# Patient Record
Sex: Female | Born: 1970 | Race: White | Hispanic: No | Marital: Married | Smoking: Former smoker
Health system: Southern US, Community
[De-identification: ages and names within clinical notes are randomized; demographics above are authoritative.]

## PROBLEM LIST (undated history)

## (undated) DIAGNOSIS — F419 Anxiety disorder, unspecified: Secondary | ICD-10-CM

## (undated) DIAGNOSIS — G43829 Menstrual migraine, not intractable, without status migrainosus: Secondary | ICD-10-CM

## (undated) DIAGNOSIS — K219 Gastro-esophageal reflux disease without esophagitis: Secondary | ICD-10-CM

## (undated) DIAGNOSIS — M1990 Unspecified osteoarthritis, unspecified site: Secondary | ICD-10-CM

## (undated) DIAGNOSIS — M329 Systemic lupus erythematosus, unspecified: Secondary | ICD-10-CM

## (undated) HISTORY — DX: Menstrual migraine, not intractable, without status migrainosus: G43.829

## (undated) HISTORY — PX: DIAGNOSTIC LAPAROSCOPY: SUR761

## (undated) HISTORY — PX: ABDOMINAL HYSTERECTOMY: SHX81

## (undated) HISTORY — DX: Unspecified osteoarthritis, unspecified site: M19.90

## (undated) HISTORY — PX: OTHER SURGICAL HISTORY: SHX169

## (undated) HISTORY — PX: DILATION AND CURETTAGE OF UTERUS: SHX78

## (undated) HISTORY — DX: Anxiety disorder, unspecified: F41.9

---

## 2011-06-06 ENCOUNTER — Ambulatory Visit: Payer: Self-pay | Admitting: Internal Medicine

## 2011-08-28 ENCOUNTER — Ambulatory Visit: Payer: Self-pay

## 2012-06-12 ENCOUNTER — Emergency Department: Payer: Self-pay | Admitting: Emergency Medicine

## 2012-06-14 LAB — BETA STREP CULTURE(ARMC)

## 2013-05-13 ENCOUNTER — Ambulatory Visit: Payer: Self-pay | Admitting: Obstetrics and Gynecology

## 2013-05-13 LAB — COMPREHENSIVE METABOLIC PANEL
Albumin: 3.6 g/dL (ref 3.4–5.0)
Alkaline Phosphatase: 57 U/L (ref 50–136)
Anion Gap: 7 (ref 7–16)
Bilirubin,Total: 0.3 mg/dL (ref 0.2–1.0)
Chloride: 105 mmol/L (ref 98–107)
Creatinine: 0.76 mg/dL (ref 0.60–1.30)
EGFR (African American): >60
Glucose: 83 mg/dL (ref 65–99)
SGOT(AST): 20 U/L (ref 15–37)
SGPT (ALT): 16 U/L (ref 12–78)
Total Protein: 7.8 g/dL (ref 6.4–8.2)

## 2013-05-13 LAB — CBC
HCT: 39.1 % (ref 35.0–47.0)
HGB: 13.2 g/dL (ref 12.0–16.0)
MCHC: 33.8 g/dL (ref 32.0–36.0)
MCV: 88 fL (ref 80–100)
Platelet: 304 10*3/uL (ref 150–440)
RDW: 12.8 % (ref 11.5–14.5)
WBC: 5.7 10*3/uL (ref 3.6–11.0)

## 2013-05-13 LAB — PREGNANCY, URINE: Pregnancy Test, Urine: NEGATIVE m[IU]/mL

## 2013-05-22 ENCOUNTER — Ambulatory Visit: Payer: Self-pay | Admitting: Obstetrics and Gynecology

## 2013-05-26 LAB — PATHOLOGY REPORT

## 2013-12-15 ENCOUNTER — Ambulatory Visit: Payer: Self-pay | Admitting: Family Medicine

## 2013-12-21 ENCOUNTER — Emergency Department: Payer: Self-pay | Admitting: Emergency Medicine

## 2013-12-21 LAB — URINALYSIS, COMPLETE
Bilirubin,UR: NEGATIVE
Glucose,UR: NEGATIVE mg/dL (ref 0–75)
KETONE: NEGATIVE
Nitrite: POSITIVE
Ph: 5 (ref 4.5–8.0)
Protein: 100
SPECIFIC GRAVITY: 1.023 (ref 1.003–1.030)

## 2013-12-23 LAB — URINE CULTURE

## 2014-01-06 ENCOUNTER — Emergency Department: Payer: Self-pay | Admitting: Emergency Medicine

## 2014-01-06 LAB — URINALYSIS, COMPLETE
BILIRUBIN, UR: NEGATIVE
Glucose,UR: NEGATIVE mg/dL (ref 0–75)
NITRITE: NEGATIVE
PH: 6 (ref 4.5–8.0)
Specific Gravity: 1.03 (ref 1.003–1.030)

## 2014-01-06 LAB — CBC
HCT: 42.6 % (ref 35.0–47.0)
HGB: 14.2 g/dL (ref 12.0–16.0)
MCH: 29.5 pg (ref 26.0–34.0)
MCHC: 33.3 g/dL (ref 32.0–36.0)
MCV: 89 fL (ref 80–100)
Platelet: 243 10*3/uL (ref 150–440)
RBC: 4.81 10*6/uL (ref 3.80–5.20)
RDW: 13.3 % (ref 11.5–14.5)
WBC: 5.8 10*3/uL (ref 3.6–11.0)

## 2014-01-06 LAB — COMPREHENSIVE METABOLIC PANEL
Albumin: 3.7 g/dL (ref 3.4–5.0)
Alkaline Phosphatase: 51 U/L
Anion Gap: 5 — ABNORMAL LOW (ref 7–16)
BUN: 7 mg/dL (ref 7–18)
Bilirubin,Total: 0.4 mg/dL (ref 0.2–1.0)
CREATININE: 0.76 mg/dL (ref 0.60–1.30)
Calcium, Total: 8.8 mg/dL (ref 8.5–10.1)
Chloride: 100 mmol/L (ref 98–107)
Co2: 28 mmol/L (ref 21–32)
EGFR (Non-African Amer.): >60
GLUCOSE: 97 mg/dL (ref 65–99)
Osmolality: 264 (ref 275–301)
Potassium: 3.4 mmol/L — ABNORMAL LOW (ref 3.5–5.1)
SGOT(AST): 19 U/L (ref 15–37)
SGPT (ALT): 15 U/L (ref 12–78)
Sodium: 133 mmol/L — ABNORMAL LOW (ref 136–145)
Total Protein: 8.6 g/dL — ABNORMAL HIGH (ref 6.4–8.2)

## 2014-07-15 ENCOUNTER — Ambulatory Visit: Payer: Self-pay | Admitting: Gastroenterology

## 2014-11-26 NOTE — Op Note (Signed)
PATIENT NAME:  Vicki Reese, Vicki Reese MR#:  [id number] DATE OF BIRTH:  [birth year]  DATE OF PROCEDURE:  05/22/2013  PREOPERATIVE DIAGNOSES:  1.  Chronic pelvic pain.  2.  Right ovarian cyst, suspicious for endometrioma.   POSTOPERATIVE DIAGNOSES: 1.  Chronic pelvic pain.  2.  Right ovarian cyst, suspicious for endometrioma.   PROCEDURE: 1.  Operative laparoscopy.  2.  Right ovarian cystectomy.  3.  Fulguration of endometriosis implants.   ANESTHESIA: General.   SURGEON: Prentice Docker, M.D.   ASSISTANT SURGEON: Malachy Mood, MD   ESTIMATED BLOOD LOSS: 50 mL.  OPERATIVE FLUIDS: 1000 mL crystalloid.   COMPLICATIONS: None.   FINDINGS:  1.  Cyst on right ovary, appears consistent with endometrioma.  2.  Multiple implants throughout pelvis consistent with endometriosis implants.  3.  Uterus with irregular contour, consistent with likely fibroids. 4.  Otherwise normal appearing pelvic anatomy.   SPECIMEN: Right ovarian cyst wall.   CONDITION AT THE END OF PROCEDURE: Stable.   PROCEDURE IN DETAIL: The patient was taken to the operating room where general anesthesia was administered and found to be adequate. She was placed in the dorsal supine lithotomy position and prepped and draped in the usual sterile fashion. After a timeout was called, a red rubber catheter was used for straight catheterization with return of no urine. A sterile sponge on a stick was placed in the vagina for uterine manipulation.   Attention was turned to the abdomen where, after injection of local anesthetic, a 5 mm infraumbilical incision was made with a scalpel. The abdomen and was entered using direct visualization with the Optiview trocar method. Verification of abdominal entry was obtained by opening pressure. The abdomen was then insufflated with CO2. The scope was then reintroduced through the 5 mm port and atraumatic entry was verified. The abdomen was surveyed with the above-noted findings. The right  ovarian cyst was unroofed and then attempt was made to remove the cyst intact. However, the cyst did  rupture. After removal of the cyst wall and fulguration of the cyst wall where the cyst wall was attached to the ovary to obtain hemostasis and to reduce any remaining endometriosis disease, attention was then turned to the rest of the pelvis. There were endometriosis implants located throughout the cul-de-sac, especially along the right uterosacral ligament, which was then fulgurated using monopolar electrocautery. Anterior to the uterus near the round ligament, there were noted to be multiple endometriosis implants, which were also similarly fulgurated. After no obvious lesions remained that could be destroyed, the procedure was terminated. Copious lavage of the pelvis was undertaken and suctioned and hemostasis was verified.   All instrumentation was removed after the abdomen was desufflated of CO2. Each incision was closed with Dermabond and additional lidocaine was injected for pain control.   The patient tolerated the procedure well. Sponge, lap, and needle counts were correct x 2. The patient was wearing pneumatic compression stockings for VTE prophylaxis throughout the entire procedure. At the end of the procedure, the sponge stick was removed from the vagina and the vagina was inspected to ensure that no instrumentation remained. She was awakened in the operating room and taken to the recovery room in stable condition.    ____________________________ Will Bonnet, MD sdj:aw D: 05/22/2013 10:41:27 ET T: 05/22/2013 11:51:13 ET JOB#: [id number]  cc: Will Bonnet, MD, <Dictator> Will Bonnet MD ELECTRONICALLY SIGNED 06/04/2013 14:49

## 2014-12-06 LAB — HM PAP SMEAR

## 2015-01-17 ENCOUNTER — Telehealth: Payer: Self-pay | Admitting: Physician Assistant

## 2015-01-17 DIAGNOSIS — F329 Major depressive disorder, single episode, unspecified: Secondary | ICD-10-CM

## 2015-01-17 DIAGNOSIS — F419 Anxiety disorder, unspecified: Secondary | ICD-10-CM | POA: Insufficient documentation

## 2015-01-17 DIAGNOSIS — M222X9 Patellofemoral disorders, unspecified knee: Secondary | ICD-10-CM | POA: Insufficient documentation

## 2015-01-17 DIAGNOSIS — F32A Depression, unspecified: Secondary | ICD-10-CM | POA: Insufficient documentation

## 2015-01-17 MED ORDER — BUPROPION HCL ER (SR) 150 MG PO TB12: 150.0000 mg | ORAL_TABLET | Freq: Every day | ORAL | Status: DC

## 2015-01-17 NOTE — Telephone Encounter (Signed)
Wellbutrin 153m Rx sent to [facility].  If not enough control with 1544mmay take 1 tablet q 12 hrs instead of one daily.  Needs f/u in one month to evaluate effects with wellbutrin if she does not already have an appt.  Thanks! -JB

## 2015-01-17 NOTE — Telephone Encounter (Signed)
LMTCB

## 2015-01-17 NOTE — Telephone Encounter (Signed)
Pt request Jenni's nurse call her back because she would like to know what her last A1C was and she wanted to go over her medications with a nurse. Thanks TNP

## 2015-01-17 NOTE — Telephone Encounter (Signed)
Patient advised as direct below. Patient verbalized understanding. Patient will call back to schedule 1 month follow up appointment.

## 2015-01-17 NOTE — Telephone Encounter (Signed)
Returned patient's call. Patient wanted to know her last A1C.  Patient also states she has been trying to wean off Paxil and she is doing okay ,but still a little moody. Patient states she is taking Paxil every other day and she feels "edgy" . Patient states she does not believe she will be able to not take the medication at all.   Patient is requesting to try Wellbutrin as discussed at last OV. Please advise.   [facility] [address]

## 2015-01-19 DIAGNOSIS — M719 Bursopathy, unspecified: Secondary | ICD-10-CM | POA: Insufficient documentation

## 2015-01-19 DIAGNOSIS — R635 Abnormal weight gain: Secondary | ICD-10-CM | POA: Insufficient documentation

## 2015-01-19 DIAGNOSIS — N809 Endometriosis, unspecified: Secondary | ICD-10-CM | POA: Insufficient documentation

## 2015-02-11 ENCOUNTER — Other Ambulatory Visit: Payer: Self-pay | Admitting: Physician Assistant

## 2015-02-14 ENCOUNTER — Ambulatory Visit (INDEPENDENT_AMBULATORY_CARE_PROVIDER_SITE_OTHER): Payer: PRIVATE HEALTH INSURANCE | Admitting: Physician Assistant

## 2015-02-14 ENCOUNTER — Encounter: Payer: Self-pay | Admitting: Physician Assistant

## 2015-02-14 VITALS — BP 106/64 | HR 68 | Temp 98.1°F | Resp 16 | Wt 171.4 lb

## 2015-02-14 DIAGNOSIS — F329 Major depressive disorder, single episode, unspecified: Secondary | ICD-10-CM | POA: Diagnosis not present

## 2015-02-14 DIAGNOSIS — F32A Depression, unspecified: Secondary | ICD-10-CM

## 2015-02-14 DIAGNOSIS — G479 Sleep disorder, unspecified: Secondary | ICD-10-CM | POA: Diagnosis not present

## 2015-02-14 MED ORDER — BUPROPION HCL ER (XL) 300 MG PO TB24: 300.0000 mg | ORAL_TABLET | Freq: Every day | ORAL | Status: DC

## 2015-02-14 NOTE — Patient Instructions (Signed)
Bupropion extended-release tablets (Depression/Mood Disorders) What is this medicine? BUPROPION (byoo PROE pee on) is used to treat depression. This medicine may be used for other purposes; ask your health care provider or pharmacist if you have questions. COMMON BRAND NAME(S): Aplenzin, Budeprion XL, Forfivo XL, Wellbutrin XL What should I tell my health care provider before I take this medicine? They need to know if you have any of these conditions: -an eating disorder, such as anorexia or bulimia -bipolar disorder or psychosis -diabetes or high blood sugar, treated with medication -glaucoma -head injury or brain tumor -heart disease, previous heart attack, or irregular heart beat -high blood pressure -kidney or liver disease -seizures (convulsions) -suicidal thoughts or a previous suicide attempt -Tourette's syndrome -weight loss -an unusual or allergic reaction to bupropion, other medicines, foods, dyes, or preservatives -breast-feeding -pregnant or trying to become pregnant How should I use this medicine? Take this medicine by mouth with a glass of water. Follow the directions on the prescription label. You can take it with or without food. If it upsets your stomach, take it with food. Do not crush, chew, or cut these tablets. This medicine is taken once daily at the same time each day. Do not take your medicine more often than directed. Do not stop taking this medicine suddenly except upon the advice of your doctor. Stopping this medicine too quickly may cause serious side effects or your condition may worsen. A special MedGuide will be given to you by the pharmacist with each prescription and refill. Be sure to read this information carefully each time. Talk to your pediatrician regarding the use of this medicine in children. Special care may be needed. Overdosage: If you think you have taken too much of this medicine contact a poison control center or emergency room at once. NOTE:  This medicine is only for you. Do not share this medicine with others. What if I miss a dose? If you miss a dose, skip the missed dose and take your next tablet at the regular time. Do not take double or extra doses. What may interact with this medicine? Do not take this medicine with any of the following medications: -linezolid -MAOIs like Azilect, Carbex, Eldepryl, Marplan, Nardil, and Parnate -methylene blue (injected into a vein) -other medicines that contain bupropion like Zyban This medicine may also interact with the following medications: -alcohol -certain medicines for anxiety or sleep -certain medicines for blood pressure like metoprolol, propranolol -certain medicines for depression or psychotic disturbances -certain medicines for HIV or AIDS like efavirenz, lopinavir, nelfinavir, ritonavir -certain medicines for irregular heart beat like propafenone, flecainide -certain medicines for Parkinson's disease like amantadine, levodopa -certain medicines for seizures like carbamazepine, phenytoin, phenobarbital -cimetidine -clopidogrel -cyclophosphamide -furazolidone -isoniazid -nicotine -orphenadrine -procarbazine -steroid medicines like prednisone or cortisone -stimulant medicines for attention disorders, weight loss, or to stay awake -tamoxifen -theophylline -thiotepa -ticlopidine -tramadol -warfarin This list may not describe all possible interactions. Give your health care provider a list of all the medicines, herbs, non-prescription drugs, or dietary supplements you use. Also tell them if you smoke, drink alcohol, or use illegal drugs. Some items may interact with your medicine. What should I watch for while using this medicine? Tell your doctor if your symptoms do not get better or if they get worse. Visit your doctor or health care professional for regular checks on your progress. Because it may take several weeks to see the full effects of this medicine, it is  important to continue your treatment as prescribed  by your doctor. Patients and their families should watch out for new or worsening thoughts of suicide or depression. Also watch out for sudden changes in feelings such as feeling anxious, agitated, panicky, irritable, hostile, aggressive, impulsive, severely restless, overly excited and hyperactive, or not being able to sleep. If this happens, especially at the beginning of treatment or after a change in dose, call your health care professional. Avoid alcoholic drinks while taking this medicine. Drinking large amounts of alcoholic beverages, using sleeping or anxiety medicines, or quickly stopping the use of these agents while taking this medicine may increase your risk for a seizure. Do not drive or use heavy machinery until you know how this medicine affects you. This medicine can impair your ability to perform these tasks. Do not take this medicine close to bedtime. It may prevent you from sleeping. Your mouth may get dry. Chewing sugarless gum or sucking hard candy, and drinking plenty of water may help. Contact your doctor if the problem does not go away or is severe. The tablet shell for some brands of this medicine does not dissolve. This is normal. The tablet shell may appear whole in the stool. This is not a cause for concern. What side effects may I notice from receiving this medicine? Side effects that you should report to your doctor or health care professional as soon as possible: -allergic reactions like skin rash, itching or hives, swelling of the face, lips, or tongue -breathing problems -changes in vision -confusion -fast or irregular heartbeat -hallucinations -increased blood pressure -redness, blistering, peeling or loosening of the skin, including inside the mouth -seizures -suicidal thoughts or other mood changes -unusually weak or tired -vomiting Side effects that usually do not require medical attention (report to your  doctor or health care professional if they continue or are bothersome): -change in sex drive or performance -constipation -headache -loss of appetite -nausea -tremors -weight loss This list may not describe all possible side effects. Call your doctor for medical advice about side effects. You may report side effects to FDA at 1-800-FDA-1088. Where should I keep my medicine? Keep out of the reach of children. Store at room temperature between 15 and 30 degrees C (59 and 86 degrees F). Throw away any unused medicine after the expiration date. NOTE: This sheet is a summary. It may not cover all possible information. If you have questions about this medicine, talk to your doctor, pharmacist, or health care provider.  2015, [employer]vier/Gold Standard. (2013-02-13 12:39:42)

## 2015-02-14 NOTE — Progress Notes (Signed)
Subjective:     Patient ID: Vicki Reese, female   DOB: 1970-08-11, 44 y.o.   MRN: [id number]  HPI Vicki Reese is a 44 year old female that returns to the office today for follow-up of change in medication. She recently switched from Paxil to Wellbutrin. She states that she has been tolerating the Wellbutrin well and has had no major side effects. She is compliant with medication.  She does mention however that she feels she is more emotional and cries more easily than she had previously. She also has been working on weight loss and has lost 10 pounds since she was last seen in the office.  She also mentions that she has been having difficulties going to sleep more so than usual, is finding herself to worry and make lists, and even sleep with a notepad beside her bed in case she thinks of something while she is lying still.  Review of Systems  Constitutional: Negative for fever, chills, activity change, appetite change, fatigue and unexpected weight change.  Respiratory: Negative for choking, chest tightness and shortness of breath.   Cardiovascular: Negative for chest pain and palpitations.  Neurological: Negative for dizziness, light-headedness and headaches.  Psychiatric/Behavioral: Negative for suicidal ideas, hallucinations, behavioral problems, confusion, sleep disturbance, self-injury, dysphoric mood, decreased concentration and agitation. The patient is nervous/anxious (at night). The patient is not hyperactive.        Objective:   Physical Exam  Constitutional: She is oriented to person, place, and time. She appears well-developed and well-nourished. No distress.  Cardiovascular: Normal rate, regular rhythm and normal heart sounds.  Exam reveals no gallop and no friction rub.   No murmur heard. Pulmonary/Chest: Effort normal and breath sounds normal. No respiratory distress. She has no wheezes. She has no rales.  Neurological: She is alert and oriented to person, place, and time.   Skin: She is not diaphoretic.  Psychiatric: She has a normal mood and affect. Her behavior is normal. Judgment and thought content normal.  Vitals reviewed.      Assessment:     1. Depression   2. Sleep disorder        Plan:     1. Depression We'll increase Wellbutrin to 300 mg tablet once daily to see if this gives better control of her crying spells.  Recheck in 1-2 months. - buPROPion (WELLBUTRIN XL) 300 MG 24 hr tablet; Take 1 tablet (300 mg total) by mouth daily.  Dispense: 30 tablet; Refill: 3  2. Sleep disorder Discussed sleep hygiene and sleep meditation exercises. She is willing to give this a try and we will reevaluate in 1-2 months.

## 2015-02-21 ENCOUNTER — Telehealth: Payer: Self-pay | Admitting: Physician Assistant

## 2015-02-21 NOTE — Telephone Encounter (Signed)
Pt is returning call to [place].  Please call after 4pm.  CB#[phone]/MJ

## 2015-02-24 MED ORDER — BUPROPION HCL ER (XL) 150 MG PO TB24: 300.0000 mg | ORAL_TABLET | Freq: Every day | ORAL | Status: DC

## 2015-02-24 NOTE — Telephone Encounter (Signed)
Pt called again 02/24/15 9:15,  Please return call.  [phone]

## 2015-02-24 NOTE — Telephone Encounter (Signed)
Spoke with patient and will do bupropion XR 150m tab take 2 tabs PO daily.  New Rx sent in.

## 2015-03-21 ENCOUNTER — Encounter: Payer: Self-pay | Admitting: Physician Assistant

## 2015-03-21 ENCOUNTER — Ambulatory Visit (INDEPENDENT_AMBULATORY_CARE_PROVIDER_SITE_OTHER): Payer: PRIVATE HEALTH INSURANCE | Admitting: Physician Assistant

## 2015-03-21 VITALS — BP 110/64 | HR 88 | Temp 98.5°F | Resp 20 | Ht 63.0 in | Wt 168.0 lb

## 2015-03-21 DIAGNOSIS — R7303 Prediabetes: Secondary | ICD-10-CM

## 2015-03-21 DIAGNOSIS — F329 Major depressive disorder, single episode, unspecified: Secondary | ICD-10-CM | POA: Diagnosis not present

## 2015-03-21 DIAGNOSIS — R5383 Other fatigue: Secondary | ICD-10-CM | POA: Insufficient documentation

## 2015-03-21 DIAGNOSIS — R7309 Other abnormal glucose: Secondary | ICD-10-CM

## 2015-03-21 DIAGNOSIS — R635 Abnormal weight gain: Secondary | ICD-10-CM | POA: Diagnosis not present

## 2015-03-21 DIAGNOSIS — R61 Generalized hyperhidrosis: Secondary | ICD-10-CM

## 2015-03-21 DIAGNOSIS — F32A Depression, unspecified: Secondary | ICD-10-CM

## 2015-03-21 DIAGNOSIS — N809 Endometriosis, unspecified: Secondary | ICD-10-CM

## 2015-03-21 LAB — POCT GLYCOSYLATED HEMOGLOBIN (HGB A1C): Hemoglobin A1C: 5.5

## 2015-03-21 MED ORDER — NORETHIN ACE-ETH ESTRAD-FE 1-20 MG-MCG(24) PO TABS: 1.0000 | ORAL_TABLET | Freq: Every day | ORAL | Status: DC

## 2015-03-21 NOTE — Progress Notes (Signed)
Patient ID: Vicki Reese, female   DOB: [birth year], 44 y.o.   MRN: [id number]       Patient: Vicki Reese Post Female    DOB: [birth year]   44 y.o.   MRN: [id number] Visit Date: 03/21/2015  Today's Provider: Mar Daring, PA-C   Chief Complaint  Patient presents with  . Diabetes  . Night Sweats  . Depression   Subjective:    HPI  Prediabetes, Follow-up:   Lab Results  Component Value Date   GLUCOSE 97 01/06/2014   GLUCOSE 83 05/13/2013   Results for orders placed or performed in visit on 03/21/15  POCT glycosylated hemoglobin (Hb A1C)  Result Value Ref Range   Hemoglobin A1C 5.5      Last seen for for this3 months ago.  Management changes included healthy lifestyle. Current symptoms include none.  Weight trend: decreasing steadily Prior visit with dietician: no Current diet: in general, a "healthy" diet  . Patient reports that she cut out sweets and low carbs. Current exercise: exercise videos, 3 times a week.   Patient reports that she is still having sweats at night. Patient reports symptom unchanged.  Pertinent Labs:    Component Value Date/Time   CREATININE 0.76 01/06/2014 1740    Wt Readings from Last 3 Encounters:  03/21/15 168 lb (76.204 kg)  02/14/15 171 lb 6.4 oz (77.747 kg)  12/23/14 179 lb (81.194 kg)   Depression Follow up: Patient complains of depression. She complains of depressed mood. Onset was approximately several years ago, gradually improving since that time.  She denies current suicidal and homicidal plan or intent.   Family history significant for no psychiatric illness.Possible organic causes contributing are: none.  Risk factors: none Previous treatment includes Paxil and medication. She complains of the following side effects from the treatment: none. Patient reports good tolerance and symptom control on Wellbutrin.   ------------------------------------------------------------------------       No Known Allergies Previous  Medications   BUPROPION (WELLBUTRIN XL) 150 MG 24 HR TABLET    Take 2 tablets (300 mg total) by mouth daily.   LO LOESTRIN FE 1 MG-10 MCG / 10 MCG TABLET    TAKE 1 TABLET BY ORAL ROUTE ONCE DAILY FOR 28 DAYS   MELOXICAM (MOBIC) 15 MG TABLET    Take 15 mg by mouth daily.    Review of Systems  Constitutional: Positive for diaphoresis and fatigue (much improved from previous however).  Respiratory: Negative.   Cardiovascular: Negative.   Endocrine: Negative.   Genitourinary: Negative.   Psychiatric/Behavioral: Negative.     Social History  Substance Use Topics  . Smoking status: Former Research scientist (life sciences)  . Smokeless tobacco: Never Used     Comment: QUIT IN 2008  . Alcohol Use: 0.0 oz/week    0 Standard drinks or equivalent per week     Comment: OCCASIONALLY   Objective:   BP 110/64 mmHg  Pulse 88  Temp(Src) 98.5 F (36.9 C) (Oral)  Resp 20  Ht 5' 3"  (1.6 m)  Wt 168 lb (76.204 kg)  BMI 29.77 kg/m2  LMP 03/06/2015 (Exact Date)  Physical Exam  Constitutional: She appears well-developed and well-nourished. No distress.  Cardiovascular: Normal rate, regular rhythm and normal heart sounds.   No murmur heard. Pulmonary/Chest: Effort normal and breath sounds normal. No respiratory distress. She has no wheezes. She has no rales.  Skin: Skin is warm and dry. She is not diaphoretic.  Psychiatric: She has a normal mood and affect. Her behavior is  normal. Judgment and thought content normal.  Vitals reviewed.       Assessment & Plan:     1. Pre-diabetes Hemoglobin A1c improved to 5.5 today. Advised her To continue diet and exercise and healthy lifestyle changes. Will recheck in one year. - POCT glycosylated hemoglobin (Hb A1C)  2. Abnormal weight gain She is doing well with healthy lifestyle changes, diet and exercise. She has lost a total of 14 pounds in 2 months.  3. Other fatigue States fatigue is much improved with Wellbutrin 300 mg daily. She is to continue the Wellbutrin as  prescribed as well as her healthy lifestyle changes and daily exercise.  4. Night sweats States that the night sweats have gradually been worsening over time. She has not had any improvement with the Wellbutrin. I will increase her oral contraceptive as below. She is to call the office if the hot flashes do not improve with the increased estrogen. - Norethindrone Acetate-Ethinyl Estrad-FE (LOESTRIN 24 FE) 1-20 MG-MCG(24) tablet; Take 1 tablet by mouth daily.  Dispense: 1 Package; Refill: 11  5. Endometriosis She has a history of this. She is followed by Dr. Glennon Mac. She was previously put on low Loestrin for her endometriosis and fibroids. I will increase the dosage to Loestrin 1 mg-20 mg as stated below for night sweats. She is to call the office if she has any worsening symptoms with the change in medication. - Norethindrone Acetate-Ethinyl Estrad-FE (LOESTRIN 24 FE) 1-20 MG-MCG(24) tablet; Take 1 tablet by mouth daily.  Dispense: 1 Package; Refill: 11  6. Depression Much improved with the Wellbutrin 300 mg daily. Continue current medical treatment plan. She is to call the office if she has any changes or worsening symptoms. Will recheck in 6 months.       Mar Daring, PA-C  [facility]

## 2015-03-21 NOTE — Patient Instructions (Signed)
Exercise to Lose Weight Exercise and a healthy diet may help you lose weight. Your doctor may suggest specific exercises. EXERCISE IDEAS AND TIPS  Choose low-cost things you enjoy doing, such as walking, bicycling, or exercising to workout videos.  Take stairs instead of the elevator.  Walk during your lunch break.  Park your car further away from work or school.  Go to a gym or an exercise class.  Start with 5 to 10 minutes of exercise each day. Build up to 30 minutes of exercise 4 to 6 days a week.  Wear shoes with good support and comfortable clothes.  Stretch before and after working out.  Work out until you breathe harder and your heart beats faster.  Drink extra water when you exercise.  Do not do so much that you hurt yourself, feel dizzy, or get very short of breath. Exercises that burn about 150 calories:  Running 1  miles in 15 minutes.  Playing volleyball for 45 to 60 minutes.  Washing and waxing a car for 45 to 60 minutes.  Playing touch football for 45 minutes.  Walking 1  miles in 35 minutes.  Pushing a stroller 1  miles in 30 minutes.  Playing basketball for 30 minutes.  Raking leaves for 30 minutes.  Bicycling 5 miles in 30 minutes.  Walking 2 miles in 30 minutes.  Dancing for 30 minutes.  Shoveling snow for 15 minutes.  Swimming laps for 20 minutes.  Walking up stairs for 15 minutes.  Bicycling 4 miles in 15 minutes.  Gardening for 30 to 45 minutes.  Jumping rope for 15 minutes.  Washing windows or floors for 45 to 60 minutes. Document Released: 08/25/2010 Document Revised: 10/15/2011 Document Reviewed: 08/25/2010 [facility] Patient Information 2015 [place], [place]. This information is not intended to replace advice given to you by your health care provider. Make sure you discuss any questions you have with your health care provider. Calorie Counting for Weight Loss Calories are energy you get from the things you eat and drink. Your  body uses this energy to keep you going throughout the day. The number of calories you eat affects your weight. When you eat more calories than your body needs, your body stores the extra calories as fat. When you eat fewer calories than your body needs, your body burns fat to get the energy it needs. Calorie counting means keeping track of how many calories you eat and drink each day. If you make sure to eat fewer calories than your body needs, you should lose weight. In order for calorie counting to work, you will need to eat the number of calories that are right for you in a day to lose a healthy amount of weight per week. A healthy amount of weight to lose per week is usually 1-2 lb (0.5-0.9 kg). A dietitian can determine how many calories you need in a day and give you suggestions on how to reach your calorie goal.  WHAT IS MY MY PLAN? My goal is to have __________ calories per day.  If I have this many calories per day, I should lose around __________ pounds per week. WHAT DO I NEED TO KNOW ABOUT CALORIE COUNTING? In order to meet your daily calorie goal, you will need to:  Find out how many calories are in each food you would like to eat. Try to do this before you eat.  Decide how much of the food you can eat.  Write down what you ate and  how many calories it had. Doing this is called keeping a food log. WHERE DO I FIND CALORIE INFORMATION? The number of calories in a food can be found on a Nutrition Facts label. Note that all the information on a label is based on a specific serving of the food. If a food does not have a Nutrition Facts label, try to look up the calories online or ask your dietitian for help. HOW DO I DECIDE HOW MUCH TO EAT? To decide how much of the food you can eat, you will need to consider both the number of calories in one serving and the size of one serving. This information can be found on the Nutrition Facts label. If a food does not have a Nutrition Facts label, look  up the information online or ask your dietitian for help. Remember that calories are listed per serving. If you choose to have more than one serving of a food, you will have to multiply the calories per serving by the amount of servings you plan to eat. For example, the label on a package of bread might say that a serving size is 1 slice and that there are 90 calories in a serving. If you eat 1 slice, you will have eaten 90 calories. If you eat 2 slices, you will have eaten 180 calories. HOW DO I KEEP A FOOD LOG? After each meal, record the following information in your food log:  What you ate.  How much of it you ate.  How many calories it had.  Then, add up your calories. Keep your food log near you, such as in a small notebook in your pocket. Another option is to use a mobile app or website. Some programs will calculate calories for you and show you how many calories you have left each time you add an item to the log. WHAT ARE SOME CALORIE COUNTING TIPS?  Use your calories on foods and drinks that will fill you up and not leave you hungry. Some examples of this include foods like nuts and nut butters, vegetables, lean proteins, and high-fiber foods (more than 5 g fiber per serving).  Eat nutritious foods and avoid empty calories. Empty calories are calories you get from foods or beverages that do not have many nutrients, such as candy and soda. It is better to have a nutritious high-calorie food (such as an avocado) than a food with few nutrients (such as a bag of chips).  Know how many calories are in the foods you eat most often. This way, you do not have to look up how many calories they have each time you eat them.  Look out for foods that may seem like low-calorie foods but are really high-calorie foods, such as baked goods, soda, and fat-free candy.  Pay attention to calories in drinks. Drinks such as sodas, specialty coffee drinks, alcohol, and juices have a lot of calories yet do  not fill you up. Choose low-calorie drinks like water and diet drinks.  Focus your calorie counting efforts on higher calorie items. Logging the calories in a garden salad that contains only vegetables is less important than calculating the calories in a milk shake.  Find a way of tracking calories that works for you. Get creative. Most people who are successful find ways to keep track of how much they eat in a day, even if they do not count every calorie. WHAT ARE SOME PORTION CONTROL TIPS?  Know how many calories are in a  serving. This will help you know how many servings of a certain food you can have.  Use a measuring cup to measure serving sizes. This is helpful when you start out. With time, you will be able to estimate serving sizes for some foods.  Take some time to put servings of different foods on your favorite plates, bowls, and cups so you know what a serving looks like.  Try not to eat straight from a bag or box. Doing this can lead to overeating. Put the amount you would like to eat in a cup or on a plate to make sure you are eating the right portion.  Use smaller plates, glasses, and bowls to prevent overeating. This is a quick and easy way to practice portion control. If your plate is smaller, less food can fit on it.  Try not to multitask while eating, such as watching TV or using your computer. If it is time to eat, sit down at a table and enjoy your food. Doing this will help you to start recognizing when you are full. It will also make you more aware of what and how much you are eating. HOW CAN I CALORIE COUNT WHEN EATING OUT?  Ask for smaller portion sizes or child-sized portions.  Consider sharing an entree and sides instead of getting your own entree.  If you get your own entree, eat only half. Ask for a box at the beginning of your meal and put the rest of your entree in it so you are not tempted to eat it.  Look for the calories on the menu. If calories are listed,  choose the lower calorie options.  Choose dishes that include vegetables, fruits, whole grains, low-fat dairy products, and lean protein. Focusing on smart food choices from each of the 5 food groups can help you stay on track at restaurants.  Choose items that are boiled, broiled, grilled, or steamed.  Choose water, milk, unsweetened iced tea, or other drinks without added sugars. If you want an alcoholic beverage, choose a lower calorie option. For example, a regular margarita can have up to 700 calories and a glass of wine has around 150.  Stay away from items that are buttered, battered, fried, or served with cream sauce. Items labeled "crispy" are usually fried, unless stated otherwise.  Ask for dressings, sauces, and syrups on the side. These are usually very high in calories, so do not eat much of them.  Watch out for salads. Many people think salads are a healthy option, but this is often not the case. Many salads come with bacon, fried chicken, lots of cheese, fried chips, and dressing. All of these items have a lot of calories. If you want a salad, choose a garden salad and ask for grilled meats or steak. Ask for the dressing on the side, or ask for olive oil and vinegar or lemon to use as dressing.  Estimate how many servings of a food you are given. For example, a serving of cooked rice is  cup or about the size of half a tennis ball or one cupcake wrapper. Knowing serving sizes will help you be aware of how much food you are eating at restaurants. The list below tells you how big or small some common portion sizes are based on everyday objects.  1 oz--4 stacked dice.  3 oz--1 deck of cards.  1 tsp--1 dice.  1 Tbsp-- a Ping-Pong ball.  2 Tbsp--1 Ping-Pong ball.   cup--1 tennis ball  or 1 cupcake wrapper.  1 cup--1 baseball. Document Released: 07/23/2005 Document Revised: 12/07/2013 Document Reviewed: 05/28/2013 [facility] Patient Information 2015 [place], [place]. This  information is not intended to replace advice given to you by your health care provider. Make sure you discuss any questions you have with your health care provider.

## 2015-05-05 ENCOUNTER — Ambulatory Visit (INDEPENDENT_AMBULATORY_CARE_PROVIDER_SITE_OTHER): Payer: PRIVATE HEALTH INSURANCE | Admitting: Physician Assistant

## 2015-05-05 ENCOUNTER — Encounter: Payer: Self-pay | Admitting: Physician Assistant

## 2015-05-05 VITALS — BP 100/78 | HR 72 | Temp 98.2°F | Resp 18 | Wt 166.0 lb

## 2015-05-05 DIAGNOSIS — R110 Nausea: Secondary | ICD-10-CM

## 2015-05-05 DIAGNOSIS — H8110 Benign paroxysmal vertigo, unspecified ear: Secondary | ICD-10-CM

## 2015-05-05 MED ORDER — PROMETHAZINE HCL 25 MG PO TABS: 25.0000 mg | ORAL_TABLET | Freq: Three times a day (TID) | ORAL | Status: DC | PRN

## 2015-05-05 MED ORDER — MECLIZINE HCL 32 MG PO TABS: 32.0000 mg | ORAL_TABLET | Freq: Three times a day (TID) | ORAL | Status: DC | PRN

## 2015-05-05 MED ORDER — PROMETHAZINE HCL 25 MG/ML IJ SOLN
25.0000 mg | Freq: Once | INTRAMUSCULAR | Status: AC
  Administered 2015-05-05: 25 mg via INTRAMUSCULAR

## 2015-05-05 NOTE — Progress Notes (Signed)
Patient: Vicki Reese Post Female    DOB: [birth year]   44 y.o.   MRN: [id number] Visit Date: 05/05/2015  Today's Vicki Reese: Mar Daring, PA-C   Chief Complaint  Patient presents with  . Dizziness   Subjective:    Dizziness This is a new problem. The current episode started in the past 7 days. The problem occurs intermittently (only when stands up). The problem has been unchanged. Associated symptoms include chills, fatigue, myalgias, nausea, vertigo and weakness. Pertinent negatives include no abdominal pain, chest pain, congestion, fever or visual change. The symptoms are aggravated by standing and walking. She has tried drinking (anti nausea medicine OTC) for the symptoms. The treatment provided no relief.  She has no appetite secondary to nausea.  This all started on Sunday evening.  She did not take any medication until Tuesday evening.  She took dramamine prior to bed on Tuesday and it helped her sleep, but when she awoke the nausea and dizziness had returned.  She has not taken it regularly.  She denies headaches, double vision, or vomiting.  The dizziness occurs only with movements, mostly going from lying/sitting to standing.     No Known Allergies Previous Medications   BUPROPION (WELLBUTRIN XL) 150 MG 24 HR TABLET    Take 2 tablets (300 mg total) by mouth daily.   MELOXICAM (MOBIC) 15 MG TABLET    Take 15 mg by mouth daily.   NORETHINDRONE ACETATE-ETHINYL ESTRAD-FE (LOESTRIN 24 FE) 1-20 MG-MCG(24) TABLET    Take 1 tablet by mouth daily.    Review of Systems  Constitutional: Positive for chills and fatigue. Negative for fever.  HENT: Negative for congestion and sinus pressure.   Eyes: Negative.   Respiratory: Positive for chest tightness (a little) and shortness of breath (a little).   Cardiovascular: Negative.  Negative for chest pain and palpitations.  Gastrointestinal: Positive for nausea. Negative for abdominal pain.  Endocrine: Negative.   Genitourinary:  Negative.   Musculoskeletal: Positive for myalgias.  Skin: Negative.   Allergic/Immunologic: Negative.   Neurological: Positive for dizziness, vertigo, tremors (when patient sits up feels body shaking), weakness and light-headedness.  Hematological: Negative.   Psychiatric/Behavioral: The patient is nervous/anxious.     Social History  Substance Use Topics  . Smoking status: Former Research scientist (life sciences)  . Smokeless tobacco: Never Used     Comment: QUIT IN 2008  . Alcohol Use: 0.0 oz/week    0 Standard drinks or equivalent per week     Comment: OCCASIONALLY   Objective:   BP 100/78 mmHg  Pulse 72  Temp(Src) 98.2 F (36.8 C) (Oral)  Resp 18  Wt 166 lb (75.297 kg)  LMP 04/28/2015  Physical Exam  Constitutional: She is oriented to person, place, and time. She appears well-developed and well-nourished. No distress.  HENT:  Head: Normocephalic and atraumatic.  Right Ear: Hearing, tympanic membrane, external ear and ear canal normal.  Left Ear: Hearing, tympanic membrane, external ear and ear canal normal.  Nose: Nose normal.  Mouth/Throat: Uvula is midline, oropharynx is clear and moist and mucous membranes are normal. No oropharyngeal exudate.  Eyes: Conjunctivae are normal. Pupils are equal, round, and reactive to light. Right eye exhibits no nystagmus. Left eye exhibits nystagmus.  Neck: Normal range of motion. Neck supple. No tracheal deviation present. No thyromegaly present.  Cardiovascular: Normal rate, regular rhythm and normal heart sounds.  Exam reveals no gallop and no friction rub.   No murmur heard. Pulmonary/Chest: Effort normal  and breath sounds normal. No respiratory distress. She has no wheezes. She has no rales.  Lymphadenopathy:    She has no cervical adenopathy.  Neurological: She is alert and oriented to person, place, and time. She has normal strength and normal reflexes. No cranial nerve deficit or sensory deficit. She exhibits normal muscle tone. She displays a negative  Romberg sign. Coordination normal.  Skin: She is not diaphoretic.  Vitals reviewed.       Assessment & Plan:     1. BPPV (benign paroxysmal positional vertigo), unspecified laterality I feel her symptoms are most likely BPPV.  I explained and performed the brandt-daroff exercises for her to do at home.  I also gave meclizine as below for symptomatic treatment.  I also encouraged her to increase fluids as she may have some dehydration causing some of the symptoms as well since her BP is down from her normal readings.  She is to call the office if symptoms fail to improve or worsen. - meclizine (ANTIVERT) 25 MG tablet; Take 1 tablet (32 mg total) by mouth 3 (three) times daily as needed.  Dispense: 30 tablet; Refill: 0  2. Nausea Due to her severe nausea I gave an IM injection pf promethazine as well as gave her a Rx for promethazine as below for nausea.  She is to call the office if symptoms fail to improve or worsen. - promethazine (PHENERGAN) 25 MG tablet; Take 1 tablet (25 mg total) by mouth every 8 (eight) hours as needed for nausea or vomiting.  Dispense: 20 tablet; Refill: 0 - promethazine (PHENERGAN) injection 25 mg; Inject 1 mL (25 mg total) into the muscle once.       Mar Daring, PA-C  [facility]

## 2015-05-05 NOTE — Patient Instructions (Signed)
Benign Positional Vertigo Vertigo means you feel like you or your surroundings are moving when they are not. Benign positional vertigo is the most common form of vertigo. Benign means that the cause of your condition is not serious. Benign positional vertigo is more common in older adults. CAUSES  Benign positional vertigo is the result of an upset in the labyrinth system. This is an area in the middle ear that helps control your balance. This may be caused by a viral infection, head injury, or repetitive motion. However, often no specific cause is found. SYMPTOMS  Symptoms of benign positional vertigo occur when you move your head or eyes in different directions. Some of the symptoms may include:  Loss of balance and falls.  Vomiting.  Blurred vision.  Dizziness.  Nausea.  Involuntary eye movements (nystagmus). DIAGNOSIS  Benign positional vertigo is usually diagnosed by physical exam. If the specific cause of your benign positional vertigo is unknown, your caregiver may perform imaging tests, such as magnetic resonance imaging (MRI) or computed tomography (CT). TREATMENT  Your caregiver may recommend movements or procedures to correct the benign positional vertigo. Medicines such as meclizine, benzodiazepines, and medicines for nausea may be used to treat your symptoms. In rare cases, if your symptoms are caused by certain conditions that affect the inner ear, you may need surgery. HOME CARE INSTRUCTIONS   Follow your caregiver's instructions.  Move slowly. Do not make sudden body or head movements.  Avoid driving.  Avoid operating heavy machinery.  Avoid performing any tasks that would be dangerous to you or others during a vertigo episode.  Drink enough fluids to keep your urine clear or pale yellow. SEEK IMMEDIATE MEDICAL CARE IF:   You develop problems with walking, weakness, numbness, or using your arms, hands, or legs.  You have difficulty speaking.  You develop  severe headaches.  Your nausea or vomiting continues or gets worse.  You develop visual changes.  Your family or friends notice any behavioral changes.  Your condition gets worse.  You have a fever.  You develop a stiff neck or sensitivity to light. MAKE SURE YOU:   Understand these instructions.  Will watch your condition.  Will get help right away if you are not doing well or get worse. Document Released: 04/30/2006 Document Revised: 10/15/2011 Document Reviewed: 04/12/2011 [facility] Patient Information 2015 [place], [place]. This information is not intended to replace advice given to you by your health care provider. Make sure you discuss any questions you have with your health care provider.

## 2015-08-02 ENCOUNTER — Encounter: Payer: Self-pay | Admitting: Physician Assistant

## 2015-08-02 ENCOUNTER — Ambulatory Visit (INDEPENDENT_AMBULATORY_CARE_PROVIDER_SITE_OTHER): Payer: PRIVATE HEALTH INSURANCE | Admitting: Physician Assistant

## 2015-08-02 VITALS — BP 110/60 | HR 87 | Temp 98.5°F | Resp 16 | Wt 171.2 lb

## 2015-08-02 DIAGNOSIS — R300 Dysuria: Secondary | ICD-10-CM | POA: Diagnosis not present

## 2015-08-02 DIAGNOSIS — N3000 Acute cystitis without hematuria: Secondary | ICD-10-CM | POA: Diagnosis not present

## 2015-08-02 LAB — POCT URINALYSIS DIPSTICK
BILIRUBIN UA: NEGATIVE
Glucose, UA: 100
KETONES UA: NEGATIVE
Nitrite, UA: NEGATIVE
PH UA: 6
Protein, UA: NEGATIVE
Spec Grav, UA: 1.02
Urobilinogen, UA: 0.2

## 2015-08-02 MED ORDER — CIPROFLOXACIN HCL 500 MG PO TABS: 500.0000 mg | ORAL_TABLET | Freq: Two times a day (BID) | ORAL | Status: DC

## 2015-08-02 MED ORDER — PHENAZOPYRIDINE HCL 200 MG PO TABS: 200.0000 mg | ORAL_TABLET | Freq: Three times a day (TID) | ORAL | Status: DC | PRN

## 2015-08-02 NOTE — Progress Notes (Signed)
Patient: Vicki Reese Post Female    DOB: 04/09/1971   44 y.o.   MRN: [id number] Visit Date: 08/02/2015  Today's Provider: Mar Daring, PA-C   Chief Complaint  Patient presents with  . Urinary Tract Infection   Subjective:    Urinary Tract Infection  This is a new problem. The current episode started today (early in the morning around 12:30 to 1:00 am and took AZO and Cystex). The problem occurs every urination. The problem has been unchanged. The quality of the pain is described as burning and aching. The pain is at a severity of 3/10. The pain is mild. Maximum temperature: Low grade patient woke up on sweat and took Ibuprofen. Associated symptoms include frequency and sweats (Last night). Pertinent negatives include no chills, discharge, flank pain, hematuria, nausea, urgency or vomiting. Treatments tried: Azo and Cystex. The treatment provided no relief.       No Known Allergies Previous Medications   BUPROPION (WELLBUTRIN XL) 300 MG 24 HR TABLET    Take 300 mg by mouth daily.   NORETHINDRONE ACETATE-ETHINYL ESTRAD-FE (LOESTRIN 24 FE) 1-20 MG-MCG(24) TABLET    Take 1 tablet by mouth daily.    Review of Systems  Constitutional: Negative for fever and chills.  Respiratory: Negative.   Cardiovascular: Negative.   Gastrointestinal: Positive for abdominal pain (Lower abdomen like menstrual cramp). Negative for nausea and vomiting.  Genitourinary: Positive for dysuria, frequency and difficulty urinating. Negative for urgency, hematuria, flank pain and vaginal discharge.  All other systems reviewed and are negative.   Social History  Substance Use Topics  . Smoking status: Former Research scientist (life sciences)  . Smokeless tobacco: Never Used     Comment: QUIT IN 2008  . Alcohol Use: 0.0 oz/week    0 Standard drinks or equivalent per week     Comment: OCCASIONALLY   Objective:   BP 110/60 mmHg  Pulse 87  Temp(Src) 98.5 F (36.9 C) (Oral)  Resp 16  Wt 171 lb 3.2 oz (77.656 kg)  LMP  06/20/2015  Physical Exam  Constitutional: She is oriented to person, place, and time. She appears well-developed and well-nourished. No distress.  Cardiovascular: Normal rate, regular rhythm and normal heart sounds.  Exam reveals no gallop and no friction rub.   No murmur heard. Pulmonary/Chest: Effort normal and breath sounds normal. No respiratory distress. She has no wheezes. She has no rales.  Abdominal: Soft. Normal appearance and bowel sounds are normal. She exhibits no distension and no mass. There is no hepatosplenomegaly. There is tenderness in the suprapubic area. There is no rebound, no guarding and no CVA tenderness.  Neurological: She is alert and oriented to person, place, and time.  Skin: Skin is warm and dry. She is not diaphoretic.  Vitals reviewed.       Assessment & Plan:     1. Acute cystitis without hematuria UA was positive today in the office. I will treat below with ciprofloxacin as this has worked for her well in the past. I will also send her urine for culture. I will adjust antibiotic treatment as necessary pending the results of the culture and sensitivities. I will also prescribe Pyridium as below for the burning and discomfort she is having currently. He is to make sure to stay well-hydrated. She is to call the office if symptoms fail to improve or worsen. - Urine culture - ciprofloxacin (CIPRO) 500 MG tablet; Take 1 tablet (500 mg total) by mouth 2 (two) times daily.  Dispense: 14 tablet; Refill: 0 - phenazopyridine (PYRIDIUM) 200 MG tablet; Take 1 tablet (200 mg total) by mouth 3 (three) times daily as needed for pain.  Dispense: 21 tablet; Refill: 0  2. Dysuria UA was positive for leukocytes. - POCT urinalysis dipstick       Mar Daring, PA-C  [facility]

## 2015-08-02 NOTE — Patient Instructions (Signed)

## 2015-08-04 ENCOUNTER — Telehealth: Payer: Self-pay

## 2015-08-04 LAB — PLEASE NOTE

## 2015-08-04 LAB — URINE CULTURE

## 2015-08-04 LAB — SPECIMEN STATUS REPORT

## 2015-08-04 NOTE — Telephone Encounter (Signed)
Patient advised as directed below. Patient verbalized understanding.  

## 2015-08-04 NOTE — Telephone Encounter (Signed)
-----   Message from Mar Daring, [place] sent at 08/04/2015 10:21 AM EST ----- Urine culture was positive for E. Coli. It is susceptible to the antibiotic I put you on. Continue antibiotic until completed. Call if symptoms persist.

## 2015-08-04 NOTE — Telephone Encounter (Signed)
LMTCB

## 2015-09-03 ENCOUNTER — Other Ambulatory Visit: Payer: Self-pay | Admitting: Physician Assistant

## 2015-09-21 ENCOUNTER — Ambulatory Visit: Payer: PRIVATE HEALTH INSURANCE | Admitting: Physician Assistant

## 2015-09-28 ENCOUNTER — Encounter: Payer: Self-pay | Admitting: Physician Assistant

## 2015-09-28 ENCOUNTER — Ambulatory Visit (INDEPENDENT_AMBULATORY_CARE_PROVIDER_SITE_OTHER): Payer: PRIVATE HEALTH INSURANCE | Admitting: Physician Assistant

## 2015-09-28 VITALS — BP 98/68 | HR 66 | Temp 98.2°F | Resp 16 | Wt 170.6 lb

## 2015-09-28 DIAGNOSIS — F329 Major depressive disorder, single episode, unspecified: Secondary | ICD-10-CM

## 2015-09-28 DIAGNOSIS — F32A Depression, unspecified: Secondary | ICD-10-CM

## 2015-09-28 DIAGNOSIS — G4700 Insomnia, unspecified: Secondary | ICD-10-CM | POA: Diagnosis not present

## 2015-09-28 MED ORDER — ZOLPIDEM TARTRATE ER 6.25 MG PO TBCR: 6.2500 mg | EXTENDED_RELEASE_TABLET | Freq: Every evening | ORAL | Status: DC | PRN

## 2015-09-28 NOTE — Progress Notes (Signed)
Patient: Vicki Reese Female    DOB: 1970/11/04   45 y.o.   MRN: [id number] Visit Date: 09/28/2015  Today's Provider: Mar Daring, PA-C   Chief Complaint  Patient presents with  . Follow-up    Depression   Subjective:    HPI  Vicki Reese is here for 6 month follow-up Depression. Last office visit patient was much improved with Wellbutrin 300 mg daily. She said is still the same she feels good. The only thing that is still bothering her is that she cannot sleep and the sweats that she gets. Otherwise she denies not having any of this feelings: chest pain, feeling guilt, worthlessness, loss of interest in activities,anxious or hopelessness.  She states that she does not have trouble falling asleep but will wake up in the middle of the night for no reason. She states sometimes she can lay there for approximately 30 minutes to fall back asleep but there are other times where she will lay there for 2 or 3 hours and not be able to fall back asleep. She has tried sleep meditation exercises and sleep relaxation techniques. These do help occasionally but not consistently. She states the main thing that is keeping her awake when she wakes up in the middle the night is racing thoughts and anxiety.     No Known Allergies Previous Medications   BUPROPION (WELLBUTRIN XL) 300 MG 24 HR TABLET    TAKE 1 TABLET BY MOUTH EVERY DAY   CIPROFLOXACIN (CIPRO) 500 MG TABLET    Take 1 tablet (500 mg total) by mouth 2 (two) times daily.   NORETHINDRONE ACETATE-ETHINYL ESTRAD-FE (LOESTRIN 24 FE) 1-20 MG-MCG(24) TABLET    Take 1 tablet by mouth daily.   PHENAZOPYRIDINE (PYRIDIUM) 200 MG TABLET    Take 1 tablet (200 mg total) by mouth 3 (three) times daily as needed for pain.    Review of Systems  Constitutional: Negative.   Respiratory: Negative.   Cardiovascular: Negative.   Gastrointestinal: Negative.   Psychiatric/Behavioral: Positive for sleep disturbance. Negative for suicidal ideas,  dysphoric mood and agitation. The patient is not nervous/anxious.     Social History  Substance Use Topics  . Smoking status: Former Research scientist (life sciences)  . Smokeless tobacco: Never Used     Comment: QUIT IN 2008  . Alcohol Use: 0.0 oz/week    0 Standard drinks or equivalent per week     Comment: OCCASIONALLY   Objective:   BP 98/68 mmHg  Pulse 66  Temp(Src) 98.2 F (36.8 C) (Oral)  Resp 16  Wt 170 lb 9.6 oz (77.384 kg)  Physical Exam  Constitutional: She appears well-developed and well-nourished. No distress.  Cardiovascular: Normal rate, regular rhythm and normal heart sounds.  Exam reveals no gallop and no friction rub.   No murmur heard. Pulmonary/Chest: Effort normal and breath sounds normal. No respiratory distress. She has no wheezes. She has no rales.  Skin: She is not diaphoretic.  Psychiatric: She has a normal mood and affect. Her behavior is normal. Judgment and thought content normal.  Vitals reviewed.       Assessment & Plan:     1. Depression Stable. Continue current medical treatment plan with Wellbutrin 300 mg. She is to call the office if symptoms start to worsen again.  2. Cannot sleep No change. Will add Ambien extended release 6.25 mg as below. I will see her back in 4 weeks to evaluate how she is doing with the Ambien as  well as get her annual physical exam with Pap at that time. She is to call the office in the meantime if she has any worsening symptoms, acute issues, questions or concerns. - zolpidem (AMBIEN CR) 6.25 MG CR tablet; Take 1 tablet (6.25 mg total) by mouth at bedtime as needed for sleep.  Dispense: 30 tablet; Refill: 0       Mar Daring, PA-C  [facility]

## 2015-09-28 NOTE — Patient Instructions (Signed)
Zolpidem extended-release tablets What is this medicine? ZOLPIDEM (zole PI dem) is used to treat insomnia. This medicine helps you to fall asleep and sleep through the night. This medicine may be used for other purposes; ask your health care provider or pharmacist if you have questions. What should I tell my health care provider before I take this medicine? They need to know if you have any of these conditions: -depression -history of drug abuse or addiction -if you often drink alcohol -liver disease -lung or breathing disease -myasthenia gravis -sleep apnea -suicidal thoughts, plans, or attempt; a previous suicide attempt by you or a family member -an unusual or allergic reaction to zolpidem, other medicines, foods, dyes, or preservatives -pregnant or trying to get pregnant -breast-feeding How should I use this medicine? Take this medicine by mouth with a glass of water. Follow the directions on the prescription label. Do not crush, split, or chew the tablet before swallowing. It is better to take this medicine on an empty stomach and only when you are ready for bed. Do not take your medicine more often than directed. If you have been taking this medicine for several weeks and suddenly stop taking it, you may get unpleasant withdrawal symptoms. Your doctor or health care professional may want to gradually reduce the dose. Do not stop taking this medicine on your own. Always follow your doctor or health care professional's advice. A special MedGuide will be given to you by the pharmacist with each prescription and refill. Be sure to read this information carefully each time. Talk to your pediatrician regarding the use of this medicine in children. Special care may be needed. Overdosage: If you think you have taken too much of this medicine contact a poison control center or emergency room at once. NOTE: This medicine is only for you. Do not share this medicine with others. What if I miss a  dose? This does not apply. This medicine should only be taken immediately before going to sleep. Do not take double or extra doses. What may interact with this medicine? -alcohol -antihistamines for allergy, cough and cold -certain medicines for anxiety or sleep -certain medicines for depression, like amitriptyline, fluoxetine, sertraline -certain medicines for fungal infections like ketoconazole and itraconazole -certain medicines for seizures like phenobarbital, primidone -ciprofloxacin -dietary supplements for sleep, like valerian or kava kava -general anesthetics like halothane, isoflurane, methoxyflurane, propofol -local anesthetics like lidocaine, pramoxine, tetracaine -medicines that relax muscles for surgery -narcotic medicines for pain -phenothiazines like chlorpromazine, mesoridazine, prochlorperazine, thioridazine -rifampin This list may not describe all possible interactions. Give your health care provider a list of all the medicines, herbs, non-prescription drugs, or dietary supplements you use. Also tell them if you smoke, drink alcohol, or use illegal drugs. Some items may interact with your medicine. What should I watch for while using this medicine? Visit your doctor or health care professional for regular checks on your progress. Keep a regular sleep schedule by going to bed at about the same time each night. Avoid caffeine-containing drinks in the evening hours. When sleep medicines are used every night for more than a few weeks, they may stop working. Talk to your doctor if your insomnia worsens or is not better within 7 to 10 days. After taking this medicine for sleep, you may get up out of bed while not being fully awake and do an activity that you do not know you are doing. The next morning, you may have no memory of the event. Activities such as  driving a car ("sleep-driving"), making and eating food, talking on the phone, sexual activity, and sleep-walking have been  reported. Call your doctor right away if you find out you have done any of these activities. Do not take this medicine if you have used alcohol that evening or before bed or taken another medicine for sleep since your risk of doing these sleep-related activities will be increased. Do not take this medicine unless you are able to stay in bed for a full night (7 to 8 hours) before you must be active again. Do not drive, use machinery, or do anything that needs mental alertness the day after you take this medicine. You may have a decrease in mental alertness the day after use, even if you feel that you are fully awake. Tell your doctor if you will need to perform activities requiring full alertness, such as driving, the next day. Do not stand or sit up quickly, especially if you are an older patient. This reduces the risk of dizzy or fainting spells. If you or your family notice any changes in your moods or behavior, such as new or worsening depression, thoughts of harming yourself, anxiety, other unusual or disturbing thoughts, or memory loss, call your doctor right away. After you stop taking this medicine, you may have trouble falling asleep. This is called rebound insomnia. This problem usually goes away on its own after 1 or 2 nights. What side effects may I notice from receiving this medicine? Side effects that you should report to your doctor or health care professional as soon as possible: -allergic reactions like skin rash, itching or hives, swelling of the face, lips, or tongue -breathing problems -changes in vision -confusion -depressed mood or other changes in moods or emotions -feeling faint or lightheaded, falls -hallucinations -loss of balance or coordination -loss of memory -restlessness, excitability, or feelings of anxiety or agitation -suicidal thoughts -unusual activities while asleep like driving, eating, making phone calls, or sexual activity Side effects that usually do not  require medical attention (report to your doctor or health care professional if they continue or are bothersome): -dizziness -drowsiness the day after you take this medicine -headache This list may not describe all possible side effects. Call your doctor for medical advice about side effects. You may report side effects to FDA at 1-800-FDA-1088. Where should I keep my medicine? Keep out of the reach of children. This medicine can be abused. Keep your medicine in a safe place to protect it from theft. Do not share this medicine with anyone. Selling or giving away this medicine is dangerous and against the law. This medicine may cause accidental overdose and death if taken by other adults, children, or pets. Mix any unused medicine with a substance like cat litter or coffee grounds. Then throw the medicine away in a sealed container like a sealed bag or a coffee can with a lid. Do not use the medicine after the expiration date. Store at controlled room temperature between 15 and 25 degrees C (59 and 77 degrees F). NOTE: This sheet is a summary. It may not cover all possible information. If you have questions about this medicine, talk to your doctor, pharmacist, or health care provider.    2016, [employer]vier/Gold Standard. (2015-03-28 16:48:57)

## 2015-10-27 ENCOUNTER — Ambulatory Visit (INDEPENDENT_AMBULATORY_CARE_PROVIDER_SITE_OTHER): Payer: BLUE CROSS/BLUE SHIELD | Admitting: Physician Assistant

## 2015-10-27 ENCOUNTER — Encounter: Payer: Self-pay | Admitting: Physician Assistant

## 2015-10-27 VITALS — BP 118/70 | HR 68 | Temp 98.6°F | Resp 16 | Wt 169.0 lb

## 2015-10-27 DIAGNOSIS — R110 Nausea: Secondary | ICD-10-CM

## 2015-10-27 DIAGNOSIS — G43109 Migraine with aura, not intractable, without status migrainosus: Secondary | ICD-10-CM

## 2015-10-27 MED ORDER — KETOROLAC TROMETHAMINE 30 MG/ML IM SOLN: 30.0000 mg | Freq: Once | INTRAMUSCULAR | Status: AC

## 2015-10-27 MED ORDER — BUTALBITAL-APAP-CAFFEINE 50-325-40 MG PO TABS: 1.0000 | ORAL_TABLET | Freq: Two times a day (BID) | ORAL | Status: DC | PRN

## 2015-10-27 MED ORDER — PROMETHAZINE HCL 25 MG/ML IJ SOLN
25.0000 mg | Freq: Once | INTRAMUSCULAR | Status: AC
  Administered 2015-10-27: 25 mg via INTRAMUSCULAR

## 2015-10-27 MED ORDER — KETOROLAC TROMETHAMINE 30 MG/ML IJ SOLN
30.0000 mg | Freq: Once | INTRAMUSCULAR | Status: AC
  Administered 2015-10-27: 30 mg via INTRAMUSCULAR

## 2015-10-27 NOTE — Progress Notes (Signed)
Patient: Vicki Reese Post Female    DOB: 1970/12/29   45 y.o.   MRN: [id number] Visit Date: 10/27/2015  Today's Provider: Mar Daring, PA-C   Chief Complaint  Patient presents with  . Headache   Subjective:    Headache  This is a new problem. The current episode started in the past 7 days (X 3 days). The problem occurs constantly. The problem has been gradually worsening. The pain is located in the temporal region. The pain quality is not similar to prior headaches (She used to get headache at the begining of her menstrual cycle but they were no t like this one.). The quality of the pain is described as dull and sharp (Her headache is only located on her left side of her face fom her eye to eyebrown and to the back of her head on the left side.). The pain is at a severity of 8/10 (She gets some shooting pain from her eye to her eyebrown and radiates to her left temporal.). The pain is severe (She also reports that when lays on the left side the pain is more frequently.). Associated symptoms include blurred vision (when she covers her left eye to help the pain she sees little black lights), dizziness and nausea. Pertinent negatives include no abdominal pain, back pain, coughing, ear pain, facial sweating, fever, numbness, rhinorrhea, seizures, sinus pressure, sore throat, swollen glands, tingling, visual change, vomiting or weakness. Associated symptoms comments: The noise bothers her. She reports by hearing the typing that I am doing it bothers her. She feels scare on how this headache is. She is teary in the office.. She has tried antidepressants (Sudafed and Ibuprofen) for the symptoms. The treatment provided no relief.  She has been under a lot of stress recently with the passing of her mother-in-law. She states she has been worried about her husband and they have been dealing with family stress secondary to finalizing the will of her mother-in-law.    No Known Allergies Previous  Medications   BUPROPION (WELLBUTRIN XL) 300 MG 24 HR TABLET    TAKE 1 TABLET BY MOUTH EVERY DAY   CIPROFLOXACIN (CIPRO) 500 MG TABLET    Take 1 tablet (500 mg total) by mouth 2 (two) times daily.   NORETHINDRONE ACETATE-ETHINYL ESTRAD-FE (LOESTRIN 24 FE) 1-20 MG-MCG(24) TABLET    Take 1 tablet by mouth daily.   PHENAZOPYRIDINE (PYRIDIUM) 200 MG TABLET    Take 1 tablet (200 mg total) by mouth 3 (three) times daily as needed for pain.   ZOLPIDEM (AMBIEN CR) 6.25 MG CR TABLET    Take 1 tablet (6.25 mg total) by mouth at bedtime as needed for sleep.    Review of Systems  Constitutional: Positive for activity change (Yesterday she just stayed in the bed.). Negative for fever.  HENT: Negative for ear pain, rhinorrhea, sinus pressure and sore throat.   Eyes: Positive for blurred vision (when she covers her left eye to help the pain she sees little black lights).  Respiratory: Negative for cough, chest tightness, shortness of breath and wheezing.   Cardiovascular: Negative for chest pain, palpitations and leg swelling.  Gastrointestinal: Positive for nausea. Negative for vomiting and abdominal pain.  Musculoskeletal: Negative for back pain, arthralgias and gait problem.  Neurological: Positive for dizziness and headaches. Negative for tingling, seizures, weakness and numbness.  Psychiatric/Behavioral: Negative.     Social History  Substance Use Topics  . Smoking status: Former Research scientist (life sciences)  . Smokeless  tobacco: Never Used     Comment: QUIT IN 2008  . Alcohol Use: 0.0 oz/week    0 Standard drinks or equivalent per week     Comment: OCCASIONALLY   Objective:   BP 118/70 mmHg  Pulse 68  Temp(Src) 98.6 F (37 C) (Oral)  Resp 16  Wt 169 lb (76.658 kg)  LMP   Physical Exam  Constitutional: She is oriented to person, place, and time. She appears well-developed and well-nourished. No distress.  Eyes: Conjunctivae and EOM are normal. Pupils are equal, round, and reactive to light. Right eye exhibits  no discharge. Left eye exhibits no discharge. Right eye exhibits no nystagmus. Left eye exhibits no nystagmus.  Neck: Normal range of motion. Neck supple. No JVD present. No tracheal deviation present. No thyromegaly present.  Cardiovascular: Normal rate, regular rhythm and normal heart sounds.  Exam reveals no gallop and no friction rub.   No murmur heard. Pulmonary/Chest: Effort normal and breath sounds normal. No respiratory distress. She has no wheezes. She has no rales.  Lymphadenopathy:    She has no cervical adenopathy.  Neurological: She is alert and oriented to person, place, and time. She has normal strength. No cranial nerve deficit or sensory deficit. She displays a negative Romberg sign. Coordination and gait normal.  Skin: She is not diaphoretic.  Psychiatric: She has a normal mood and affect. Her behavior is normal. Judgment and thought content normal.  Vitals reviewed.       Assessment & Plan:     1. Migraine with aura and without status migrainosus, not intractable New onset migraine. Most likely secondary to stress. Will get neuro eval because she is very concerned of new onset migraine. She does get menstrual headaches but this is different and has not subsided with her normal treatments. Will also give toradol and phenergan today for relief.  Patient was kept 15 min to make sure she tolerated well and then sent home. She is also given fiorcet for future migraines. She is to call if symptoms worsen or recur without relief before her neuro appointment.  - Ambulatory referral to Neurology - butalbital-acetaminophen-caffeine (FIORICET, ESGIC) 50-325-40 MG tablet; Take 1 tablet by mouth 2 (two) times daily as needed for headache.  Dispense: 14 tablet; Refill: 0 - ketorolac (TORADOL) injection 30 mg; Inject 1 mL (30 mg total) into the muscle once. - promethazine (PHENERGAN) injection 25 mg; Inject 1 mL (25 mg total) into the muscle once.  2. Nausea Phenergan injection given as  below without complication. - promethazine (PHENERGAN) injection 25 mg; Inject 1 mL (25 mg total) into the muscle once.       Mar Daring, PA-C  [facility]

## 2015-10-27 NOTE — Patient Instructions (Signed)
Migraine Headache A migraine headache is an intense, throbbing pain on one or both sides of your head. A migraine can last for 30 minutes to several hours. CAUSES  The exact cause of a migraine headache is not always known. However, a migraine may be caused when nerves in the brain become irritated and release chemicals that cause inflammation. This causes pain. Certain things may also trigger migraines, such as:  Alcohol.  Smoking.  Stress.  Menstruation.  Aged cheeses.  Foods or drinks that contain nitrates, glutamate, aspartame, or tyramine.  Lack of sleep.  Chocolate.  Caffeine.  Hunger.  Physical exertion.  Fatigue.  Medicines used to treat chest pain (nitroglycerine), birth control pills, estrogen, and some blood pressure medicines. SIGNS AND SYMPTOMS  Pain on one or both sides of your head.  Pulsating or throbbing pain.  Severe pain that prevents daily activities.  Pain that is aggravated by any physical activity.  Nausea, vomiting, or both.  Dizziness.  Pain with exposure to bright lights, loud noises, or activity.  General sensitivity to bright lights, loud noises, or smells. Before you get a migraine, you may get warning signs that a migraine is coming (aura). An aura may include:  Seeing flashing lights.  Seeing bright spots, halos, or zigzag lines.  Having tunnel vision or blurred vision.  Having feelings of numbness or tingling.  Having trouble talking.  Having muscle weakness. DIAGNOSIS  A migraine headache is often diagnosed based on:  Symptoms.  Physical exam.  A CT scan or MRI of your head. These imaging tests cannot diagnose migraines, but they can help rule out other causes of headaches. TREATMENT Medicines may be given for pain and nausea. Medicines can also be given to help prevent recurrent migraines.  HOME CARE INSTRUCTIONS  Only take over-the-counter or prescription medicines for pain or discomfort as directed by your  health care provider. The use of long-term narcotics is not recommended.  Lie down in a dark, quiet room when you have a migraine.  Keep a journal to find out what may trigger your migraine headaches. For example, write down:  What you eat and drink.  How much sleep you get.  Any change to your diet or medicines.  Limit alcohol consumption.  Quit smoking if you smoke.  Get 7-9 hours of sleep, or as recommended by your health care provider.  Limit stress.  Keep lights dim if bright lights bother you and make your migraines worse. SEEK IMMEDIATE MEDICAL CARE IF:   Your migraine becomes severe.  You have a fever.  You have a stiff neck.  You have vision loss.  You have muscular weakness or loss of muscle control.  You start losing your balance or have trouble walking.  You feel faint or pass out.  You have severe symptoms that are different from your first symptoms. MAKE SURE YOU:   Understand these instructions.  Will watch your condition.  Will get help right away if you are not doing well or get worse.   This information is not intended to replace advice given to you by your health care provider. Make sure you discuss any questions you have with your health care provider.   Document Released: 07/23/2005 Document Revised: 08/13/2014 Document Reviewed: 03/30/2013 Elsevier Interactive Patient Education 2016 Elsevier [employer]  Acetaminophen; Butalbital; Caffeine tablets or capsules What is this medicine? ACETAMINOPHEN; BUTALBITAL; CAFFEINE (a set a MEE noe fen; byoo TAL bi tal; KAF een) is a pain reliever. It is used to treat  tension headaches. This medicine may be used for other purposes; ask your health care provider or pharmacist if you have questions. What should I tell my health care provider before I take this medicine? They need to know if you have any of these conditions: -drug abuse or addiction -heart or circulation problems -if you often drink  alcohol -kidney disease or problems going to the bathroom -liver disease -lung disease, asthma, or breathing problems -porphyria -an unusual or allergic reaction to acetaminophen, butalbital or other barbiturates, caffeine, other medicines, foods, dyes, or preservatives -pregnant or trying to get pregnant -breast-feeding How should I use this medicine? Take this medicine by mouth with a full glass of water. Follow the directions on the prescription label. If the medicine upsets your stomach, take the medicine with food or milk. Do not take more than you are told to take. Talk to your pediatrician regarding the use of this medicine in children. Special care may be needed. Overdosage: If you think you have taken too much of this medicine contact a poison control center or emergency room at once. NOTE: This medicine is only for you. Do not share this medicine with others. What if I miss a dose? If you miss a dose, take it as soon as you can. If it is almost time for your next dose, take only that dose. Do not take double or extra doses. What may interact with this medicine? -alcohol or medicines that contain alcohol -antidepressants, especially MAOIs like isocarboxazid, phenelzine, tranylcypromine, and selegiline -antihistamines -benzodiazepines -carbamazepine -isoniazid -medicines for pain like pentazocine, buprenorphine, butorphanol, nalbuphine, tramadol, and propoxyphene -muscle relaxants -naltrexone -phenobarbital, phenytoin, and fosphenytoin -phenothiazines like perphenazine, thioridazine, chlorpromazine, mesoridazine, fluphenazine, prochlorperazine, promazine, and trifluoperazine -voriconazole This list may not describe all possible interactions. Give your health care provider a list of all the medicines, herbs, non-prescription drugs, or dietary supplements you use. Also tell them if you smoke, drink alcohol, or use illegal drugs. Some items may interact with your medicine. What  should I watch for while using this medicine? Tell your doctor or health care professional if your pain does not go away, if it gets worse, or if you have new or a different type of pain. You may develop tolerance to the medicine. Tolerance means that you will need a higher dose of the medicine for pain relief. Tolerance is normal and is expected if you take the medicine for a long time. Do not suddenly stop taking your medicine because you may develop a severe reaction. Your body becomes used to the medicine. This does NOT mean you are addicted. Addiction is a behavior related to getting and using a drug for a non-medical reason. If you have pain, you have a medical reason to take pain medicine. Your doctor will tell you how much medicine to take. If your doctor wants you to stop the medicine, the dose will be slowly lowered over time to avoid any side effects. You may get drowsy or dizzy when you first start taking the medicine or change doses. Do not drive, use machinery, or do anything that may be dangerous until you know how the medicine affects you. Stand or sit up slowly. Do not take other medicines that contain acetaminophen with this medicine. Always read labels carefully. If you have questions, ask your doctor or pharmacist. If you take too much acetaminophen get medical help right away. Too much acetaminophen can be very dangerous and cause liver damage. Even if you do not have symptoms, it is  important to get help right away. What side effects may I notice from receiving this medicine? Side effects that you should report to your doctor or health care professional as soon as possible: -allergic reactions like skin rash, itching or hives, swelling of the face, lips, or tongue -breathing problems -confusion -feeling faint or lightheaded, falls -redness, blistering, peeling or loosening of the skin, including inside the mouth -seizure -stomach pain -yellowing of the eyes or skin Side effects  that usually do not require medical attention (report to your doctor or health care professional if they continue or are bothersome): -constipation -nausea, vomiting This list may not describe all possible side effects. Call your doctor for medical advice about side effects. You may report side effects to FDA at 1-800-FDA-1088. Where should I keep my medicine? Keep out of the reach of children. This medicine can be abused. Keep your medicine in a safe place to protect it from theft. Do not share this medicine with anyone. Selling or giving away this medicine is dangerous and against the law. This medicine may cause accidental overdose and death if it taken by other adults, children, or pets. Mix any unused medicine with a substance like cat litter or coffee grounds. Then throw the medicine away in a sealed container like a sealed bag or a coffee can with a lid. Do not use the medicine after the expiration date. Store at room temperature between 15 and 30 degrees C (59 and 86 degrees F). NOTE: This sheet is a summary. It may not cover all possible information. If you have questions about this medicine, talk to your doctor, pharmacist, or health care provider.    2016, Elsevier/Gold Standard. (2013-09-18 15:00:25)

## 2015-10-28 ENCOUNTER — Encounter: Payer: PRIVATE HEALTH INSURANCE | Admitting: Physician Assistant

## 2015-11-07 ENCOUNTER — Ambulatory Visit (INDEPENDENT_AMBULATORY_CARE_PROVIDER_SITE_OTHER): Payer: BLUE CROSS/BLUE SHIELD | Admitting: Neurology

## 2015-11-07 ENCOUNTER — Encounter: Payer: Self-pay | Admitting: Neurology

## 2015-11-07 VITALS — BP 115/75 | HR 91 | Ht 63.0 in | Wt 172.0 lb

## 2015-11-07 DIAGNOSIS — N943 Premenstrual tension syndrome: Secondary | ICD-10-CM | POA: Diagnosis not present

## 2015-11-07 DIAGNOSIS — G43829 Menstrual migraine, not intractable, without status migrainosus: Secondary | ICD-10-CM | POA: Insufficient documentation

## 2015-11-07 HISTORY — DX: Menstrual migraine, not intractable, without status migrainosus: G43.829

## 2015-11-07 NOTE — Patient Instructions (Signed)

## 2015-11-07 NOTE — Progress Notes (Signed)
Reason for visit: Migraine headache  Referring physician: Dr. Zipporah Plants Post is a 45 y.o. female  History of present illness:  Vicki Reese is a 45 year old right-handed white female with a history of migraine headache. The patient began having headaches after her second pregnancy, she has had headaches for 6 or 7 years. The patient's usual headaches are bifrontal in nature, and are generally occurring one or 2 days a month around the time of her menstrual cycle. The patient reports photophobia, but no nausea or vomiting with the headache. The patient had a more severe headache 2 weeks ago associated with left-sided headache pain in the frontal area and eventually spreading to the back of the head. The patient has had more significant nausea, no vomiting, spots in front eyes and significant photophobia and phonophobia. The headache lasted 2 days, then cleared. Advil which usually helps her headaches but did not help this headache. The patient was seen by her primary care physician, she was given Fioricet to take for the pain. She also got an injection of Toradol and Phenergan. The patient denies any family history of headache. She indicates that occasionally perfumes may bring on headaches. She indicates that the strength of the estrogen in her birth control was increased 6-8 months ago. She is sent to this office for an evaluation.  Past Medical History  Diagnosis Date  . Menstrual migraine 11/07/2015    Past Surgical History  Procedure Laterality Date  . No past surgeries      Family History  Problem Relation Age of Onset  . Breast cancer Mother   . Lupus Father   . Diabetes Father     Social history:  reports that she has quit smoking. She has never used smokeless tobacco. She reports that she drinks alcohol. She reports that she does not use illicit drugs.  Medications:  Prior to Admission medications   Medication Sig Start Date End Date Taking? Authorizing Provider    buPROPion (WELLBUTRIN XL) 300 MG 24 hr tablet TAKE 1 TABLET BY MOUTH EVERY DAY 09/06/15  Yes Clearnce Sorrel Burnette, PA-C  Norethindrone Acetate-Ethinyl Estrad-FE (LOESTRIN 24 FE) 1-20 MG-MCG(24) tablet Take 1 tablet by mouth daily. 03/21/15  Yes Clearnce Sorrel Burnette, PA-C  zolpidem (AMBIEN CR) 6.25 MG CR tablet Take 1 tablet (6.25 mg total) by mouth at bedtime as needed for sleep. 09/28/15  Yes Clearnce Sorrel Burnette, PA-C  butalbital-acetaminophen-caffeine (FIORICET, ESGIC) 50-325-40 MG tablet Take 1 tablet by mouth 2 (two) times daily as needed for headache. Patient not taking: Reported on 11/07/2015 10/27/15   Mar Daring, PA-C     No Known Allergies  ROS:  Out of a complete 14 system review of symptoms, the patient complains only of the following symptoms, and all other reviewed systems are negative.  Headache, dizziness Anxiety, not enough sleep, decreased energy, racing thoughts Sleepiness  Blood pressure 115/75, pulse 91, height 5' 3"  (1.6 m), weight 172 lb (78.019 kg).  Physical Exam  General: The patient is alert and cooperative at the time of the examination. The patient is minimally obese.  Eyes: Pupils are equal, round, and reactive to light. Discs are flat bilaterally.  Neck: The neck is supple, no carotid bruits are noted.  Respiratory: The respiratory examination is clear.  Cardiovascular: The cardiovascular examination reveals a regular rate and rhythm, no obvious murmurs or rubs are noted.  Neuromuscular: Range of movement of the cervical spine is full. No crepitus in the temporomandibular joints is  noted.  Skin: Extremities are without significant edema.  Neurologic Exam  Mental status: The patient is alert and oriented x 3 at the time of the examination. The patient has apparent normal recent and remote memory, with an apparently normal attention span and concentration ability.  Cranial nerves: Facial symmetry is present. There is good sensation of the face to  pinprick and soft touch bilaterally. The strength of the facial muscles and the muscles to head turning and shoulder shrug are normal bilaterally. Speech is well enunciated, no aphasia or dysarthria is noted. Extraocular movements are full. Visual fields are full. The tongue is midline, and the patient has symmetric elevation of the soft palate. No obvious hearing deficits are noted.  Motor: The motor testing reveals 5 over 5 strength of all 4 extremities. Good symmetric motor tone is noted throughout.  Sensory: Sensory testing is intact to pinprick, soft touch, vibration sensation, and position sense on all 4 extremities. No evidence of extinction is noted.  Coordination: Cerebellar testing reveals good finger-nose-finger and heel-to-shin bilaterally.  Gait and station: Gait is normal. Tandem gait is normal. Romberg is negative. No drift is seen.  Reflexes: Deep tendon reflexes are symmetric and normal bilaterally. Toes are downgoing bilaterally.    CT head 12/15/13:  IMPRESSION:  1. No acute intracranial abnormality. No acute infarction.  2. No enhancing brain mass. No abnormal parenchymal or  leptomeningeal enhancement.     Assessment/Plan:  1. Menstrual migraine  The patient has a history of migraine headache, she had more significant headache within the last couple weeks. The patient only has one or 2 headache days a month. I would not place her on daily medications at this time. She is to take Advil and Fioricet for the pain, if this is not effective in the future, we may try Imitrex or Maxalt. She is to contact our office if she is not doing well. We will see her back on an as-needed basis.  Jill Alexanders MD 11/07/2015 1:14 PM  [facility] [address] [place], [place] [address]  Phone [phone] Fax [phone]

## 2015-11-16 ENCOUNTER — Encounter: Payer: BLUE CROSS/BLUE SHIELD | Admitting: Physician Assistant

## 2015-11-24 ENCOUNTER — Ambulatory Visit (INDEPENDENT_AMBULATORY_CARE_PROVIDER_SITE_OTHER): Payer: BLUE CROSS/BLUE SHIELD | Admitting: Physician Assistant

## 2015-11-24 ENCOUNTER — Encounter: Payer: Self-pay | Admitting: Physician Assistant

## 2015-11-24 VITALS — BP 110/70 | HR 100 | Temp 98.2°F | Resp 16 | Ht 63.0 in | Wt 174.4 lb

## 2015-11-24 DIAGNOSIS — Z6830 Body mass index (BMI) 30.0-30.9, adult: Secondary | ICD-10-CM | POA: Diagnosis not present

## 2015-11-24 DIAGNOSIS — G4700 Insomnia, unspecified: Secondary | ICD-10-CM | POA: Diagnosis not present

## 2015-11-24 DIAGNOSIS — Z0000 Encounter for general adult medical examination without abnormal findings: Secondary | ICD-10-CM | POA: Diagnosis not present

## 2015-11-24 DIAGNOSIS — Z1239 Encounter for other screening for malignant neoplasm of breast: Secondary | ICD-10-CM

## 2015-11-24 DIAGNOSIS — G43109 Migraine with aura, not intractable, without status migrainosus: Secondary | ICD-10-CM

## 2015-11-24 DIAGNOSIS — R7303 Prediabetes: Secondary | ICD-10-CM | POA: Diagnosis not present

## 2015-11-24 DIAGNOSIS — Z13220 Encounter for screening for lipoid disorders: Secondary | ICD-10-CM

## 2015-11-24 DIAGNOSIS — E669 Obesity, unspecified: Secondary | ICD-10-CM | POA: Diagnosis not present

## 2015-11-24 DIAGNOSIS — Z136 Encounter for screening for cardiovascular disorders: Secondary | ICD-10-CM

## 2015-11-24 MED ORDER — ZOLPIDEM TARTRATE ER 6.25 MG PO TBCR
6.2500 mg | EXTENDED_RELEASE_TABLET | Freq: Every evening | ORAL | Status: DC | PRN
Start: 2015-11-24 — End: 2016-06-13

## 2015-11-24 MED ORDER — BUTALBITAL-APAP-CAFFEINE 50-325-40 MG PO TABS: 1.0000 | ORAL_TABLET | Freq: Two times a day (BID) | ORAL | Status: DC | PRN

## 2015-11-24 NOTE — Progress Notes (Signed)
Patient: Vicki Reese, Female    DOB: 07/08/1971, 45 y.o.   MRN: [id number] Visit Date: 11/24/2015  Today's Provider: Mar Daring, PA-C   Chief Complaint  Patient presents with  . Annual Exam   Subjective:    Annual physical exam Vicki Reese is a 45 y.o. female who presents today for health maintenance and complete physical. She feels well today, but she has being feeling irritable since she lost her job last month. She feels happy today because she is going to go see her son in [place] for his prom. She reports not exercising. She reports she is sleeping poorly, but uses ambien CR 6.50m as needed and this helps.   She has run out of the fioricet and is in need of a refill. She saw the Neurologist the beginning of April and per patient he recommended to have the Fioricet on hand all the time.  He told her that her migraines were menstrual migraines. The fioricet does control them when it is needed.   She does continue to take the Loestrin Fe daily for control of her menstrual cycle and to lessen the flow and cramping. [facility] She does have a history of endometriosis as well. She did have heavy menses with clotting and severe cramping but the OCP has helped to decrease that. She did have an episode last month of a heavier menstrual cycle with clotting and the onset of the menstrual migraine as discussed above. She has not had any other since and that episode only lasted 3 days. She did not have severe cramping. Pap is up to date and is due in 2018.   She does perform self breast exams, just not monthly. She does them standing in the shower. She has had annual mammograms since turning 42. [facility] been [facility]. No family history of breast cancer.  -----------------------------------------------------------------   Review of Systems  Constitutional:  Positive for diaphoresis, appetite change and unexpected weight change.       Irritability  Eyes: Negative.   Respiratory: Negative.   Cardiovascular: Negative.   Gastrointestinal: Negative.   Endocrine: Positive for polyphagia.  Genitourinary: Negative.   Musculoskeletal: Negative.   Skin: Negative.   Allergic/Immunologic: Positive for environmental allergies.  Neurological: Positive for headaches.  Hematological: Negative.   Psychiatric/Behavioral: Positive for sleep disturbance. The patient is nervous/anxious.     Social History      She  reports that she has quit smoking. She has never used smokeless tobacco. She reports that she drinks alcohol. She reports that she does not use illicit drugs.       Social History   Social History  . Marital Status: Married    Spouse Name: N/A  . Number of Children: N/A  . Years of Education: N/A   Social History Main Topics  . Smoking status: Former SResearch scientist (life sciences) . Smokeless tobacco: Never Used     Comment: QUIT IN 2008  . Alcohol Use: 0.0 oz/week    0 Standard drinks or equivalent per week     Comment: OCCASIONALLY  . Drug Use: No  . Sexual Activity: Not Asked   Other Topics Concern  . None   Social History Narrative    Past Medical History  Diagnosis Date  . Menstrual migraine 11/07/2015     Patient Active Problem List   Diagnosis Date Noted  .  Body mass index (BMI) of 30.0-30.9 in adult 11/24/2015  . Obesity 11/24/2015  . Migraine with aura and without status migrainosus, not intractable 11/24/2015  . Menstrual migraine 11/07/2015  . Cannot sleep 09/28/2015  . Pre-diabetes 03/21/2015  . Fatigue 03/21/2015  . Endometriosis 01/19/2015  . Diaphoresis 01/19/2015  . Abnormal weight gain 01/19/2015  . Depression 01/17/2015    Past Surgical History  Procedure Laterality Date  . No past surgeries      Family History        Family Status  Relation Status Death Age  . Mother Alive   . Father Alive   . Brother Alive          Her family history includes Breast cancer in her mother; Diabetes in her father; Lupus in her father.    No Known Allergies  Previous Medications   BUPROPION (WELLBUTRIN XL) 300 MG 24 HR TABLET    TAKE 1 TABLET BY MOUTH EVERY DAY   NORETHINDRONE ACETATE-ETHINYL ESTRAD-FE (LOESTRIN 24 FE) 1-20 MG-MCG(24) TABLET    Take 1 tablet by mouth daily.    Patient Care Team: Mar Daring, PA-C as PCP - General (Physician Assistant)     Objective:   Vitals: BP 110/70 mmHg  Pulse 100  Temp(Src) 98.2 F (36.8 C) (Oral)  Resp 16  Ht 5' 3"  (1.6 m)  Wt 174 lb 6.4 oz (79.107 kg)  BMI 30.90 kg/m2  LMP 11/07/2015   Physical Exam  Constitutional: She is oriented to person, place, and time. She appears well-developed and well-nourished. No distress.  HENT:  Head: Normocephalic and atraumatic.  Right Ear: External ear normal.  Left Ear: External ear normal.  Nose: Nose normal.  Mouth/Throat: Oropharynx is clear and moist. No oropharyngeal exudate.  Eyes: Conjunctivae and EOM are normal. Pupils are equal, round, and reactive to light. Right eye exhibits no discharge. Left eye exhibits no discharge. No scleral icterus.  Neck: Normal range of motion. Neck supple. No JVD present. No tracheal deviation present. No thyromegaly present.  Cardiovascular: Normal rate, regular rhythm, normal heart sounds and intact distal pulses.  Exam reveals no gallop and no friction rub.   No murmur heard. Pulmonary/Chest: Effort normal and breath sounds normal. No respiratory distress. She has no wheezes. She has no rales. She exhibits no tenderness. Right breast exhibits no inverted nipple, no mass, no nipple discharge, no skin change and no tenderness. Left breast exhibits no inverted nipple, no mass, no nipple discharge, no skin change and no tenderness. Breasts are symmetrical.  Abdominal: Soft. Bowel sounds are normal. She exhibits no distension and no mass. There is no tenderness. There is no rebound  and no guarding.  Genitourinary:  Patient requested to defer until next year when pap is due.  Musculoskeletal: Normal range of motion. She exhibits no edema or tenderness.  Lymphadenopathy:    She has no cervical adenopathy.  Neurological: She is alert and oriented to person, place, and time.  Skin: Skin is warm and dry. No rash noted. She is not diaphoretic.  Psychiatric: She has a normal mood and affect. Her behavior is normal. Judgment and thought content normal.  Vitals reviewed.    Depression Screen No flowsheet data found.    Assessment & Plan:     Routine Health Maintenance and Physical Exam 1. Annual physical exam Physical exam today was normal. Will check labs as below and f/u pending lab results. If labs are stable and WNL she will not need them rechecked for one  year at her next annual physical exam with pap smear. She is to call the office in the meantime if she has any acute issue, questions or concerns. - CBC with Differential/Platelet - Comprehensive metabolic panel - TSH  2. Breast cancer screening Breast exam today was normal. Discussed self breast exams with patient. Mammogram ordered as below. Information for [facility] breast clinic was given to patient so she may schedule this at her convenience. - MM Digital Screening; Future  3. Encounter for lipid screening for cardiovascular disease Will check labs and f/u pending lab results. - Lipid panel  4. Pre-diabetes H/O this. Trying to limit carbs and sugars. Will recheck lab as below and f/u pending results. - Hemoglobin A1c  5. Migraine with aura and without status migrainosus, not intractable Stable. Diagnosis pulled for medication refill. Continue current medical treatment plan. - butalbital-acetaminophen-caffeine (FIORICET, ESGIC) 50-325-40 MG tablet; Take 1 tablet by mouth 2 (two) times daily as needed for headache.  Dispense: 14 tablet; Refill: 5  6. Cannot sleep Stable. Diagnosis pulled for  medication refill. Continue current medical treatment plan. - zolpidem (AMBIEN CR) 6.25 MG CR tablet; Take 1 tablet (6.25 mg total) by mouth at bedtime as needed for sleep.  Dispense: 30 tablet; Refill: 5  7. Body mass index (BMI) of 30.0-30.9 in adult Advised to try to increase physical activity per AHA guidelines and continue a low carbohydrate diet for sugar.   8. Obesity See above medical treatment plan.    Exercise Activities and Dietary recommendations Goals    . Exercise 150 minutes per week (moderate activity)        There is no immunization history on file for this patient.  Health Maintenance  Topic Date Due  . HIV Screening  03/13/1988  . TETANUS/TDAP  03/13/1992  . INFLUENZA VACCINE  03/06/2016  . PAP SMEAR  12/05/2017      Discussed health benefits of physical activity, and encouraged her to engage in regular exercise appropriate for her age and condition.    --------------------------------------------------------------------

## 2015-11-24 NOTE — Patient Instructions (Signed)
Health Maintenance, Female Adopting a healthy lifestyle and getting preventive care can go a long way to promote health and wellness. Talk with your health care provider about what schedule of regular examinations is right for you. This is a good chance for you to check in with your provider about disease prevention and staying healthy. In between checkups, there are plenty of things you can do on your own. Experts have done a lot of research about which lifestyle changes and preventive measures are most likely to keep you healthy. Ask your health care provider for more information. WEIGHT AND DIET  Eat a healthy diet  Be sure to include plenty of vegetables, fruits, low-fat dairy products, and lean protein.  Do not eat a lot of foods high in solid fats, added sugars, or salt.  Get regular exercise. This is one of the most important things you can do for your health.  Most adults should exercise for at least 150 minutes each week. The exercise should increase your heart rate and make you sweat (moderate-intensity exercise).  Most adults should also do strengthening exercises at least twice a week. This is in addition to the moderate-intensity exercise.  Maintain a healthy weight  Body mass index (BMI) is a measurement that can be used to identify possible weight problems. It estimates body fat based on height and weight. Your health care provider can help determine your BMI and help you achieve or maintain a healthy weight.  For females 20 years of age and older:   A BMI below 18.5 is considered underweight.  A BMI of 18.5 to 24.9 is normal.  A BMI of 25 to 29.9 is considered overweight.  A BMI of 30 and above is considered obese.  Watch levels of cholesterol and blood lipids  You should start having your blood tested for lipids and cholesterol at 43 years of age, then have this test every 5 years.  You may need to have your cholesterol levels checked more often if:  Your lipid  or cholesterol levels are high.  You are older than 43 years of age.  You are at high risk for heart disease.  CANCER SCREENING   Lung Cancer  Lung cancer screening is recommended for adults 55-80 years old who are at high risk for lung cancer because of a history of smoking.  A yearly low-dose CT scan of the lungs is recommended for people who:  Currently smoke.  Have quit within the past 15 years.  Have at least a 30-pack-year history of smoking. A pack year is smoking an average of one pack of cigarettes a day for 1 year.  Yearly screening should continue until it has been 15 years since you quit.  Yearly screening should stop if you develop a health problem that would prevent you from having lung cancer treatment.  Breast Cancer  Practice breast self-awareness. This means understanding how your breasts normally appear and feel.  It also means doing regular breast self-exams. Let your health care provider know about any changes, no matter how small.  If you are in your 20s or 30s, you should have a clinical breast exam (CBE) by a health care provider every 1-3 years as part of a regular health exam.  If you are 40 or older, have a CBE every year. Also consider having a breast X-ray (mammogram) every year.  If you have a family history of breast cancer, talk to your health care provider about genetic screening.  If you   are at high risk for breast cancer, talk to your health care provider about having an MRI and a mammogram every year.  Breast cancer gene (BRCA) assessment is recommended for women who have family members with BRCA-related cancers. BRCA-related cancers include:  Breast.  Ovarian.  Tubal.  Peritoneal cancers.  Results of the assessment will determine the need for genetic counseling and BRCA1 and BRCA2 testing. Cervical Cancer Your health care provider may recommend that you be screened regularly for cancer of the pelvic organs (ovaries, uterus, and  vagina). This screening involves a pelvic examination, including checking for microscopic changes to the surface of your cervix (Pap test). You may be encouraged to have this screening done every 3 years, beginning at age 21.  For women ages 30-65, health care providers may recommend pelvic exams and Pap testing every 3 years, or they may recommend the Pap and pelvic exam, combined with testing for human papilloma virus (HPV), every 5 years. Some types of HPV increase your risk of cervical cancer. Testing for HPV may also be done on women of any age with unclear Pap test results.  Other health care providers may not recommend any screening for nonpregnant women who are considered low risk for pelvic cancer and who do not have symptoms. Ask your health care provider if a screening pelvic exam is right for you.  If you have had past treatment for cervical cancer or a condition that could lead to cancer, you need Pap tests and screening for cancer for at least 20 years after your treatment. If Pap tests have been discontinued, your risk factors (such as having a new sexual partner) need to be reassessed to determine if screening should resume. Some women have medical problems that increase the chance of getting cervical cancer. In these cases, your health care provider may recommend more frequent screening and Pap tests. Colorectal Cancer  This type of cancer can be detected and often prevented.  Routine colorectal cancer screening usually begins at 43 years of age and continues through 43 years of age.  Your health care provider may recommend screening at an earlier age if you have risk factors for colon cancer.  Your health care provider may also recommend using home test kits to check for hidden blood in the stool.  A small camera at the end of a tube can be used to examine your colon directly (sigmoidoscopy or colonoscopy). This is done to check for the earliest forms of colorectal  cancer.  Routine screening usually begins at age 50.  Direct examination of the colon should be repeated every 5-10 years through 43 years of age. However, you may need to be screened more often if early forms of precancerous polyps or small growths are found. Skin Cancer  Check your skin from head to toe regularly.  Tell your health care provider about any new moles or changes in moles, especially if there is a change in a mole's shape or color.  Also tell your health care provider if you have a mole that is larger than the size of a pencil eraser.  Always use sunscreen. Apply sunscreen liberally and repeatedly throughout the day.  Protect yourself by wearing long sleeves, pants, a wide-brimmed hat, and sunglasses whenever you are outside. HEART DISEASE, DIABETES, AND HIGH BLOOD PRESSURE   High blood pressure causes heart disease and increases the risk of stroke. High blood pressure is more likely to develop in:  People who have blood pressure in the high end   of the normal range (130-139/85-89 mm Hg).  People who are overweight or obese.  People who are African American.  If you are 38-23 years of age, have your blood pressure checked every 3-5 years. If you are 61 years of age or older, have your blood pressure checked every year. You should have your blood pressure measured twice--once when you are at a hospital or clinic, and once when you are not at a hospital or clinic. Record the average of the two measurements. To check your blood pressure when you are not at a hospital or clinic, you can use:  An automated blood pressure machine at a pharmacy.  A home blood pressure monitor.  If you are between 45 years and 39 years old, ask your health care provider if you should take aspirin to prevent strokes.  Have regular diabetes screenings. This involves taking a blood sample to check your fasting blood sugar level.  If you are at a normal weight and have a low risk for diabetes,  have this test once every three years after 43 years of age.  If you are overweight and have a high risk for diabetes, consider being tested at a younger age or more often. PREVENTING INFECTION  Hepatitis B  If you have a higher risk for hepatitis B, you should be screened for this virus. You are considered at high risk for hepatitis B if:  You were born in a country where hepatitis B is common. Ask your health care provider which countries are considered high risk.  Your parents were born in a high-risk country, and you have not been immunized against hepatitis B (hepatitis B vaccine).  You have HIV or AIDS.  You use needles to inject street drugs.  You live with someone who has hepatitis B.  You have had sex with someone who has hepatitis B.  You get hemodialysis treatment.  You take certain medicines for conditions, including cancer, organ transplantation, and autoimmune conditions. Hepatitis C  Blood testing is recommended for:  Everyone born from [birth year] through 1963.  Anyone with known risk factors for hepatitis C. Sexually transmitted infections (STIs)  You should be screened for sexually transmitted infections (STIs) including gonorrhea and chlamydia if:  You are sexually active and are younger than 43 years of age.  You are older than 43 years of age and your health care provider tells you that you are at risk for this type of infection.  Your sexual activity has changed since you were last screened and you are at an increased risk for chlamydia or gonorrhea. Ask your health care provider if you are at risk.  If you do not have HIV, but are at risk, it may be recommended that you take a prescription medicine daily to prevent HIV infection. This is called pre-exposure prophylaxis (PrEP). You are considered at risk if:  You are sexually active and do not regularly use condoms or know the HIV status of your partner(s).  You take drugs by injection.  You are sexually  active with a partner who has HIV. Talk with your health care provider about whether you are at high risk of being infected with HIV. If you choose to begin PrEP, you should first be tested for HIV. You should then be tested every 3 months for as long as you are taking PrEP.  PREGNANCY   If you are premenopausal and you may become pregnant, ask your health care provider about preconception counseling.  If you may  become pregnant, take 400 to 800 micrograms (mcg) of folic acid every day.  If you want to prevent pregnancy, talk to your health care provider about birth control (contraception). OSTEOPOROSIS AND MENOPAUSE   Osteoporosis is a disease in which the bones lose minerals and strength with aging. This can result in serious bone fractures. Your risk for osteoporosis can be identified using a bone density scan.  If you are 61 years of age or older, or if you are at risk for osteoporosis and fractures, ask your health care provider if you should be screened.  Ask your health care provider whether you should take a calcium or vitamin D supplement to lower your risk for osteoporosis.  Menopause may have certain physical symptoms and risks.  Hormone replacement therapy may reduce some of these symptoms and risks. Talk to your health care provider about whether hormone replacement therapy is right for you.  HOME CARE INSTRUCTIONS   Schedule regular health, dental, and eye exams.  Stay current with your immunizations.   Do not use any tobacco products including cigarettes, chewing tobacco, or electronic cigarettes.  If you are pregnant, do not drink alcohol.  If you are breastfeeding, limit how much and how often you drink alcohol.  Limit alcohol intake to no more than 1 drink per day for nonpregnant women. One drink equals 12 ounces of beer, 5 ounces of wine, or 1 ounces of hard liquor.  Do not use street drugs.  Do not share needles.  Ask your health care provider for help if  you need support or information about quitting drugs.  Tell your health care provider if you often feel depressed.  Tell your health care provider if you have ever been abused or do not feel safe at home.   This information is not intended to replace advice given to you by your health care provider. Make sure you discuss any questions you have with your health care provider.   Document Released: 02/05/2011 Document Revised: 08/13/2014 Document Reviewed: 06/24/2013 Elsevier Interactive Patient Education [employer].

## 2015-12-12 ENCOUNTER — Telehealth: Payer: Self-pay | Admitting: Physician Assistant

## 2015-12-12 ENCOUNTER — Other Ambulatory Visit: Payer: Self-pay | Admitting: Physician Assistant

## 2015-12-12 DIAGNOSIS — F32A Depression, unspecified: Secondary | ICD-10-CM

## 2015-12-12 DIAGNOSIS — F329 Major depressive disorder, single episode, unspecified: Secondary | ICD-10-CM

## 2015-12-12 DIAGNOSIS — N3000 Acute cystitis without hematuria: Secondary | ICD-10-CM

## 2015-12-12 MED ORDER — BUPROPION HCL ER (XL) 300 MG PO TB24
300.0000 mg | ORAL_TABLET | Freq: Every day | ORAL | Status: DC
Start: 2015-12-12 — End: 2016-07-02

## 2015-12-12 MED ORDER — CIPROFLOXACIN HCL 500 MG PO TABS: 500.0000 mg | ORAL_TABLET | Freq: Two times a day (BID) | ORAL | Status: DC

## 2015-12-12 NOTE — Telephone Encounter (Signed)
Spoke with Vicki Reese. She has been trying increasing fluids, cranberry juice and pyridium. Symptoms have improved slightly. She did not take today and pain and frequency returned. Does have h/o recurrent UTI. No fever. No flank pain. No nausea or vomiting. Will send Cipro to [place].

## 2015-12-12 NOTE — Telephone Encounter (Signed)
Patient called nurse triage line over the weekend with UTI symptoms. I called patient and left VM to see if symptoms had cleared or if she was seen somewhere else. Will await call back.

## 2015-12-12 NOTE — Telephone Encounter (Signed)
Pt returned your call.  ThanksTeri

## 2016-01-13 ENCOUNTER — Ambulatory Visit (INDEPENDENT_AMBULATORY_CARE_PROVIDER_SITE_OTHER): Payer: BLUE CROSS/BLUE SHIELD | Admitting: Physician Assistant

## 2016-01-13 ENCOUNTER — Telehealth: Payer: Self-pay | Admitting: Physician Assistant

## 2016-01-13 ENCOUNTER — Encounter: Payer: Self-pay | Admitting: Physician Assistant

## 2016-01-13 VITALS — BP 120/80 | HR 90 | Temp 98.4°F | Resp 16 | Wt 167.2 lb

## 2016-01-13 DIAGNOSIS — R300 Dysuria: Secondary | ICD-10-CM | POA: Diagnosis not present

## 2016-01-13 DIAGNOSIS — N3000 Acute cystitis without hematuria: Secondary | ICD-10-CM | POA: Diagnosis not present

## 2016-01-13 LAB — POCT URINALYSIS DIPSTICK
Bilirubin, UA: NEGATIVE
GLUCOSE UA: NEGATIVE
Ketones, UA: NEGATIVE
Nitrite, UA: NEGATIVE
PROTEIN UA: NEGATIVE
SPEC GRAV UA: 1.02
UROBILINOGEN UA: 0.2
pH, UA: 8

## 2016-01-13 MED ORDER — CIPROFLOXACIN HCL 500 MG PO TABS: 500.0000 mg | ORAL_TABLET | Freq: Two times a day (BID) | ORAL | Status: DC

## 2016-01-13 MED ORDER — PHENAZOPYRIDINE HCL 200 MG PO TABS: 200.0000 mg | ORAL_TABLET | Freq: Three times a day (TID) | ORAL | Status: DC | PRN

## 2016-01-13 NOTE — Patient Instructions (Signed)

## 2016-01-13 NOTE — Progress Notes (Signed)
Patient: Vicki Reese Post Female    DOB: 1970-12-20   45 y.o.   MRN: [id number] Visit Date: 01/13/2016  Today's Provider: Mar Daring, PA-C   Chief Complaint  Patient presents with  . Urinary Tract Infection   Subjective:    Urinary Tract Infection  This is a new problem. The current episode started in the past 7 days (Wednesday in the middle of the night). The problem occurs every urination. The problem has been gradually worsening. The quality of the pain is described as burning and aching. The pain is at a severity of 8/10. The pain is moderate. There has been no fever. She is sexually active. Associated symptoms include frequency, nausea and urgency. Pertinent negatives include no chills, discharge, flank pain, hematuria, hesitancy or vomiting. Associated symptoms comments: Pain in her lower abdomen. She has tried increased fluids and home medications for the symptoms. The treatment provided no relief. Her past medical history is significant for recurrent UTIs.      No Known Allergies Current Meds  Medication Sig  . buPROPion (WELLBUTRIN XL) 300 MG 24 hr tablet Take 1 tablet (300 mg total) by mouth daily.  . butalbital-acetaminophen-caffeine (FIORICET, ESGIC) 50-325-40 MG tablet Take 1 tablet by mouth 2 (two) times daily as needed for headache.  . Norethindrone Acetate-Ethinyl Estrad-FE (LOESTRIN 24 FE) 1-20 MG-MCG(24) tablet Take 1 tablet by mouth daily.  Marland Kitchen zolpidem (AMBIEN CR) 6.25 MG CR tablet Take 1 tablet (6.25 mg total) by mouth at bedtime as needed for sleep.    Review of Systems  Constitutional: Negative for fever and chills.  Respiratory: Negative for cough, chest tightness and shortness of breath.   Cardiovascular: Negative for chest pain and leg swelling.  Gastrointestinal: Positive for nausea. Negative for vomiting and abdominal pain.  Genitourinary: Positive for dysuria, urgency and frequency. Negative for hesitancy, hematuria and flank pain.    Musculoskeletal: Positive for back pain (Lower back).    Social History  Substance Use Topics  . Smoking status: Former Research scientist (life sciences)  . Smokeless tobacco: Never Used     Comment: QUIT IN 2008  . Alcohol Use: 0.0 oz/week    0 Standard drinks or equivalent per week     Comment: OCCASIONALLY   Objective:   BP 120/80 mmHg  Pulse 90  Temp(Src) 98.4 F (36.9 C) (Oral)  Resp 16  Wt 167 lb 3.2 oz (75.841 kg)  LMP   Physical Exam  Constitutional: She is oriented to person, place, and time. She appears well-developed and well-nourished. No distress.  Cardiovascular: Normal rate, regular rhythm and normal heart sounds.  Exam reveals no gallop and no friction rub.   No murmur heard. Pulmonary/Chest: Effort normal and breath sounds normal. No respiratory distress. She has no wheezes. She has no rales.  Abdominal: Soft. Normal appearance and bowel sounds are normal. She exhibits no distension and no mass. There is no hepatosplenomegaly. There is tenderness in the suprapubic area. There is no rebound, no guarding and no CVA tenderness.  Neurological: She is alert and oriented to person, place, and time.  Skin: Skin is warm and dry. She is not diaphoretic.  Vitals reviewed.       Assessment & Plan:     1. Acute cystitis without hematuria UA positive for pyuria. Will treat empirically with cipro. Will send urine for culture. Will adjust antibiotic therapy as needed per C&S results. Push fluids. Pyridium given for dysuria. She is to call if symptoms fail to improve  following treatment. - ciprofloxacin (CIPRO) 500 MG tablet; Take 1 tablet (500 mg total) by mouth 2 (two) times daily.  Dispense: 14 tablet; Refill: 0 - phenazopyridine (PYRIDIUM) 200 MG tablet; Take 1 tablet (200 mg total) by mouth 3 (three) times daily as needed for pain.  Dispense: 30 tablet; Refill: 0  2. Dysuria See above medical treatment plan. - POCT urinalysis dipstick - Urine culture       Mar Daring, PA-C   [facility]

## 2016-01-13 NOTE — Telephone Encounter (Signed)
over

## 2016-01-15 LAB — URINE CULTURE: ORGANISM ID, BACTERIA: NO GROWTH

## 2016-01-15 LAB — PLEASE NOTE

## 2016-01-16 ENCOUNTER — Telehealth: Payer: Self-pay

## 2016-01-16 NOTE — Telephone Encounter (Signed)
-----   Message from Mar Daring, PA-C sent at 01/16/2016  9:44 AM EDT ----- Urine was negative. Continue antibiotic until completed since it has been started to reduce developing antibiotic resistance. Call if symptoms persist.

## 2016-01-16 NOTE — Telephone Encounter (Signed)
Patient advised as directed below.  Thanks,  -Berma Harts 

## 2016-02-27 ENCOUNTER — Encounter: Payer: Self-pay | Admitting: Physician Assistant

## 2016-02-27 MED ORDER — CIPROFLOXACIN HCL 500 MG PO TABS: 500.0000 mg | ORAL_TABLET | Freq: Two times a day (BID) | ORAL | 0 refills | Status: DC

## 2016-03-21 ENCOUNTER — Ambulatory Visit (INDEPENDENT_AMBULATORY_CARE_PROVIDER_SITE_OTHER): Payer: BLUE CROSS/BLUE SHIELD | Admitting: Physician Assistant

## 2016-03-21 ENCOUNTER — Encounter: Payer: Self-pay | Admitting: Physician Assistant

## 2016-03-21 VITALS — BP 102/82 | HR 62 | Temp 97.8°F | Resp 16 | Wt 164.6 lb

## 2016-03-21 DIAGNOSIS — N809 Endometriosis, unspecified: Secondary | ICD-10-CM

## 2016-03-21 DIAGNOSIS — N926 Irregular menstruation, unspecified: Secondary | ICD-10-CM

## 2016-03-21 DIAGNOSIS — N951 Menopausal and female climacteric states: Secondary | ICD-10-CM

## 2016-03-21 DIAGNOSIS — R232 Flushing: Secondary | ICD-10-CM

## 2016-03-21 NOTE — Progress Notes (Signed)
Patient: Vicki Reese Post Female    DOB: June 28, 1971   45 y.o.   MRN: [id number] Visit Date: 03/21/2016  Today's Provider: Mar Daring, PA-C   Chief Complaint  Patient presents with  . Night Sweats  . Menstrual Problem   Subjective:    HPI Patient presents with night sweats and irregular menstrual cycle. Patient reports having spotting between regular cycle all month. Patient also reports spotting each time she has sexual intercourse. Patient states she feels the menstrual problems are worse since changing birth control. She had previously been on lo-loestrin. She has had endometriosis in the past and underwent laparoscopic removal of endometriosis. She reports that during that time she was told she did have uterine fibroids noted during the procedure, but nothing was done for them besides OCPs. Her hot flashes and night sweats have worsened and she feels like she is having significant mood swings. She denies dysmenorrhea.    Previous Medications   BUPROPION (WELLBUTRIN XL) 300 MG 24 HR TABLET    Take 1 tablet (300 mg total) by mouth daily.   BUTALBITAL-ACETAMINOPHEN-CAFFEINE (FIORICET, ESGIC) 50-325-40 MG TABLET    Take 1 tablet by mouth 2 (two) times daily as needed for headache.   NORETHINDRONE ACETATE-ETHINYL ESTRAD-FE (LOESTRIN 24 FE) 1-20 MG-MCG(24) TABLET    Take 1 tablet by mouth daily.   ZOLPIDEM (AMBIEN CR) 6.25 MG CR TABLET    Take 1 tablet (6.25 mg total) by mouth at bedtime as needed for sleep.    Review of Systems  Constitutional: Positive for diaphoresis. Negative for chills, fatigue and fever.  Respiratory: Negative.   Cardiovascular: Negative.   Gastrointestinal: Negative.   Genitourinary: Positive for menstrual problem and vaginal bleeding. Negative for dyspareunia, flank pain, frequency, genital sores, pelvic pain, urgency, vaginal discharge and vaginal pain.  Neurological: Negative.     Social History  Substance Use Topics  . Smoking status: Former Research scientist (life sciences)    . Smokeless tobacco: Never Used     Comment: QUIT IN 2008  . Alcohol use 0.0 oz/week     Comment: OCCASIONALLY   Objective:   BP 102/82 (BP Location: Right Arm, Patient Position: Sitting, Cuff Size: Large)   Pulse 62   Temp 97.8 F (36.6 C) (Oral)   Resp 16   Wt 164 lb 9.6 oz (74.7 kg)   BMI 29.16 kg/m   Physical Exam  Constitutional: She appears well-developed and well-nourished. No distress.  Cardiovascular: Normal rate, regular rhythm and normal heart sounds.  Exam reveals no gallop and no friction rub.   No murmur heard. Pulmonary/Chest: Effort normal and breath sounds normal. No respiratory distress. She has no wheezes. She has no rales.  Skin: She is not diaphoretic.  Psychiatric: She has a normal mood and affect. Her behavior is normal. Judgment and thought content normal.  Vitals reviewed.     Assessment & Plan:     1. Abnormal menstruation Will check labs as below to make sure no other cause or near menopause. Will f/u pending lab results. Also will refer to [facility] per patient request for further evaluation. She is interested in other options than birth control, more permanent options, if offered as she states "I already have my kids and am done having kids, so they can do whatever." She had her laparoscopic surgery done by Dr. Glennon Mac at [facility] and had been followed by Clarita Leber, but does not wish to return to that office. I have placed referral as below and will follow  up with her as needed.  - [place] - LH - CBC with Differential - TSH - Ambulatory referral to Gynecology  2. Hot flashes See above medical treatment plan. - [place] - LH - CBC with Differential - TSH - Ambulatory referral to Gynecology  3. Endometriosis See above medical treatment plan. - Ambulatory referral to Gynecology   Follow up: No Follow-up on file.

## 2016-03-21 NOTE — Patient Instructions (Signed)

## 2016-03-22 ENCOUNTER — Telehealth: Payer: Self-pay

## 2016-03-22 LAB — CBC WITH DIFFERENTIAL/PLATELET
BASOS ABS: 0 10*3/uL (ref 0.0–0.2)
BASOS: 0 %
EOS (ABSOLUTE): 0 10*3/uL (ref 0.0–0.4)
Eos: 1 %
HEMATOCRIT: 38.9 % (ref 34.0–46.6)
HEMOGLOBIN: 12.9 g/dL (ref 11.1–15.9)
IMMATURE GRANS (ABS): 0 10*3/uL (ref 0.0–0.1)
Immature Granulocytes: 0 %
LYMPHS ABS: 2.5 10*3/uL (ref 0.7–3.1)
LYMPHS: 38 %
MCH: 30.1 pg (ref 26.6–33.0)
MCHC: 33.2 g/dL (ref 31.5–35.7)
MCV: 91 fL (ref 79–97)
MONOCYTES: 8 %
Monocytes Absolute: 0.5 10*3/uL (ref 0.1–0.9)
NEUTROS ABS: 3.6 10*3/uL (ref 1.4–7.0)
Neutrophils: 53 %
Platelets: 303 10*3/uL (ref 150–379)
RBC: 4.29 x10E6/uL (ref 3.77–5.28)
RDW: 13.2 % (ref 12.3–15.4)
WBC: 6.7 10*3/uL (ref 3.4–10.8)

## 2016-03-22 LAB — FOLLICLE STIMULATING HORMONE: FSH: 2.1 m[IU]/mL

## 2016-03-22 LAB — LUTEINIZING HORMONE: LH: 0.9 m[IU]/mL

## 2016-03-22 LAB — TSH: TSH: 1.53 u[IU]/mL (ref 0.450–4.500)

## 2016-03-22 NOTE — Telephone Encounter (Signed)
-----   Message from Mar Daring, [place] sent at 03/22/2016 11:03 AM EDT ----- All labs are within normal limits and stable.  Hormones are not indicating menopause yet. Will continue with referral to GYN for further consideration of other treatment options.Thanks! -JB

## 2016-03-22 NOTE — Telephone Encounter (Signed)
LMTCB  Thanks,  -Preston Weill 

## 2016-03-23 NOTE — Telephone Encounter (Signed)
Patient saw results through my chart.  Thanks,  -Marquon Alcala

## 2016-03-28 ENCOUNTER — Other Ambulatory Visit: Payer: Self-pay | Admitting: Physician Assistant

## 2016-03-28 DIAGNOSIS — R61 Generalized hyperhidrosis: Secondary | ICD-10-CM

## 2016-03-28 DIAGNOSIS — N809 Endometriosis, unspecified: Secondary | ICD-10-CM

## 2016-03-28 NOTE — Telephone Encounter (Signed)
Saw you 03/21/2016. Did address abnormal menstruation at that [place]. Renaldo Fiddler, CMA

## 2016-05-01 ENCOUNTER — Ambulatory Visit (INDEPENDENT_AMBULATORY_CARE_PROVIDER_SITE_OTHER): Payer: BLUE CROSS/BLUE SHIELD | Admitting: Obstetrics and Gynecology

## 2016-05-01 ENCOUNTER — Encounter: Payer: Self-pay | Admitting: Obstetrics and Gynecology

## 2016-05-01 VITALS — BP 115/72 | HR 98 | Ht 63.0 in | Wt 165.0 lb

## 2016-05-01 DIAGNOSIS — N941 Unspecified dyspareunia: Secondary | ICD-10-CM

## 2016-05-01 DIAGNOSIS — N949 Unspecified condition associated with female genital organs and menstrual cycle: Secondary | ICD-10-CM

## 2016-05-01 DIAGNOSIS — N809 Endometriosis, unspecified: Secondary | ICD-10-CM | POA: Diagnosis not present

## 2016-05-01 DIAGNOSIS — N939 Abnormal uterine and vaginal bleeding, unspecified: Secondary | ICD-10-CM | POA: Diagnosis not present

## 2016-05-01 DIAGNOSIS — R102 Pelvic and perineal pain: Secondary | ICD-10-CM

## 2016-05-01 DIAGNOSIS — G8929 Other chronic pain: Secondary | ICD-10-CM | POA: Diagnosis not present

## 2016-05-01 NOTE — Patient Instructions (Signed)
1. Alternative therapies for endometriosis include:  Lupron  Danazol  Mirena IUD or Depo-Provera 2. Hysterectomy is the surgical alternative for symptomatic endometriosis. Transvaginal hysterectomy or laparoscopic-assisted vaginal hysterectomy would be the options. 3. Contact us with further questions or if decision for surgery Is made  Laparoscopically Assisted Vaginal Hysterectomy A laparoscopically assisted vaginal hysterectomy (LAVH) is a surgical procedure to remove the uterus and cervix, and sometimes the ovaries and fallopian tubes. During an LAVH, some of the surgical removal is done through the vagina, and the rest is done through a few small surgical cuts (incisions) in the abdomen.  This procedure is usually considered in women when a vaginal hysterectomy is not an option. Your health care provider will discuss the risks and benefits of the different surgical techniques at your appointment. Generally, recovery time is faster and there are fewer complications after laparoscopic procedures than after open incisional procedures. LET [facility] CARE PROVIDER KNOW ABOUT:   Any allergies you have.  All medicines you are taking, including vitamins, herbs, eye drops, creams, and over-the-counter medicines.  Previous problems you or members of your family have had with the use of anesthetics.  Any blood disorders you have.  Previous surgeries you have had.  Medical conditions you have. RISKS AND COMPLICATIONS Generally, this is a safe procedure. However, as with any procedure, complications can occur. Possible complications include:  Allergies to medicines.  Difficulty breathing.  Bleeding.  Infection.  Damage to other structures near your uterus and cervix. BEFORE THE PROCEDURE  Ask your health care provider about changing or stopping your regular medicines.  Take certain medicines, such as a colon-emptying preparation, as directed.  Do not eat or drink anything for at  least 8 hours before your surgery.  Stop smoking if you smoke. Stopping will improve your health after surgery.  Arrange for a ride home after surgery and for help at home during recovery. PROCEDURE   An IV tube will be put into one of your veins in order to give you fluids and medicines.  You will receive medicines to relax you and medicines that make you sleep (general anesthetic).  You may have a flexible tube (catheter) put into your bladder to drain urine.  You may have a tube put through your nose or mouth that goes into your stomach (nasogastric tube). The nasogastric tube removes digestive fluids and prevents you from feeling nauseated and from vomiting.  Tight-fitting (compression) stockings will be placed on your legs to promote circulation.  Three to four small incisions will be made in your abdomen. An incision also will be made in your vagina. Probes and tools will be inserted into the small incisions. The uterus and cervix are removed (and possibly your ovaries and fallopian tubes) through your vagina as well as through the small incisions that were made in the abdomen.  Your vagina is then sewn back to normal. AFTER THE PROCEDURE  You may have a liquid diet temporarily. You will most likely return to, and tolerate, your usual diet the day after surgery.  You will be passing urine through a catheter. It will be removed the day after surgery.  Your temperature, breathing rate, heart rate, blood pressure, and oxygen level will be monitored regularly.  You will still wear compression stockings on your legs until you are able to move around.  You will use a special device or do breathing exercises to keep your lungs clear.  You will be encouraged to walk as soon as possible.  This information is not intended to replace advice given to you by your health care provider. Make sure you discuss any questions you have with your health care provider.   Document Released:  07/12/2011 Document Revised: 08/13/2014 Document Reviewed: 02/05/2013 Elsevier Interactive Patient Education [employer]. s made.

## 2016-05-01 NOTE — Progress Notes (Signed)
GYN ENCOUNTER NOTE  Subjective:       Vicki Reese is a 45 y.o. [id number] female is here for gynecologic evaluation of the following issues:  1. Endometriosis.   2. Fibroids  Endometriosis diagnosed in October 2014 by Dr. Glennon Mac at [facility]. Low Loestrin OCP therapy for 1.5 years; patient develops significant" crazy" side effects Loestrin 1/20 has been used for the past 6 months; still with "crazy" side effects; taking the pill continuously with sporadic bleeding as well as regular cycles (crank case type oil); she has noted recurrence of headaches on this OCP  Endometriosis pain: Bilateral pelvic stabbing and shooting pains which radiates from the adnexal regions in towards the vagina; also experiences low back pain with radiation into the right lower quadrant and right hip and thigh; is experiencing worsening deep thrusting dyspareunia  Patient is finished with childbearing  Gynecologic History Patient's last menstrual period was 04/23/2016. Contraception: OCP (estrogen/progesterone)  Obstetric History OB History  Gravida Para Term Preterm AB Living  3 2 2   1 2   SAB TAB Ectopic Multiple Live Births  1       2    # Outcome Date GA Lbr Len/2nd Weight Sex Delivery Anes PTL Lv  3 Term 2007   10 lb (4.536 kg) M Vag-Spont   LIV  2 SAB 2006          1 Term 1998   8 lb 1.9 oz (3.683 kg) M Vag-Spont   LIV      Past Medical History:  Diagnosis Date  . Anxiety   . Menstrual migraine 11/07/2015    Past Surgical History:  Procedure Laterality Date  . DILATION AND CURETTAGE OF UTERUS    . laparoscopy      Current Outpatient Prescriptions on File Prior to Visit  Medication Sig Dispense Refill  . buPROPion (WELLBUTRIN XL) 300 MG 24 hr tablet Take 1 tablet (300 mg total) by mouth daily. 90 tablet 3  . butalbital-acetaminophen-caffeine (FIORICET, ESGIC) 50-325-40 MG tablet Take 1 tablet by mouth 2 (two) times daily as needed for headache. 14 tablet 5  . JUNEL FE 1/20 1-20  MG-MCG tablet TAKE 1 TABLET BY MOUTH DAILY. 28 tablet 11  . zolpidem (AMBIEN CR) 6.25 MG CR tablet Take 1 tablet (6.25 mg total) by mouth at bedtime as needed for sleep. 30 tablet 5   No current facility-administered medications on file prior to visit.     No Known Allergies  Social History   Social History  . Marital status: Married    Spouse name: N/A  . Number of children: N/A  . Years of education: N/A   Occupational History  . Not on file.   Social History Main Topics  . Smoking status: Former Smoker    Quit date: 08/06/2006  . Smokeless tobacco: Never Used     Comment: QUIT IN 2008  . Alcohol use 0.0 oz/week     Comment: OCCASIONALLY  . Drug use: No  . Sexual activity: Yes    Birth control/ protection: Pill   Other Topics Concern  . Not on file   Social History Narrative  . No narrative on file    Family History  Problem Relation Age of Onset  . Breast cancer Mother   . Lupus Father   . Diabetes Father   . Heart disease Paternal Grandmother   . Ovarian cancer Neg Hx   . Colon cancer Neg Hx     The following portions of  the patient's history were reviewed and updated as appropriate: allergies, current medications, past family history, past medical history, past social history, past surgical history and problem list.  Review of Systems Review of Systems - Per history of present illness  Objective:   BP 115/72   Pulse 98   Ht 5' 3"  (1.6 m)   Wt 165 lb (74.8 kg)   LMP 04/23/2016   BMI 29.23 kg/m  CONSTITUTIONAL: Well-developed, well-nourished female in no acute distress.  HENT:  Normocephalic, atraumatic.  NECK: Normal range of motion, supple, no masses.  Normal thyroid.  SKIN: Skin is warm and dry. No rash noted. Not diaphoretic. No erythema. No pallor. [place]: Alert and oriented to person, place, and time. PSYCHIATRIC: Normal mood and affect. Normal behavior. Normal judgment and thought content. CARDIOVASCULAR:Not Examined RESPIRATORY: Not  Examined BREASTS: Not Examined ABDOMEN: Soft, non distended; Non tender.  No Organomegaly. PELVIC:  External Genitalia: Normal  BUS: Normal  Vagina: Normal  Cervix: Normal; 1/4 cervical motion tenderness  Uterus: Normal size, shape,consistency, mobile; midplane to retroverted; 2/4 tender  Adnexa: Normal; nonpalpable nontender  RV: Normal external exam  Bladder: Nontender MUSCULOSKELETAL: Normal range of motion. No tenderness.  No cyanosis, clubbing, or edema.     Assessment:   1. Symptomatic endometriosis, affecting activities of daily living; no further interest in childbearing 2. Uterine fibroids   Plan:   1. Medical versus surgical management strategies have been reviewed in detail. Patient is aware that birth control pill therapy and the progestin contraceptive or first-line treatments for endometriosis. Should these be inadequate, consideration for Depo-Lupron or danazol would be considered. Should the patient is finished with childbearing, definitive surgery is an option. 2. Patient is an LAVH candidate. 3. Patient will contact us regarding surgical intervention when she discusses alternatives with her husband  A total of 30 minutes were spent face-to-face with the patient during the encounter with greater than 50% dealing with counseling and coordination of care.  Brayton Mars, MD  Note: This dictation was prepared with Dragon dictation along with smaller phrase technology. Any transcriptional errors that result from this process are unintentional.

## 2016-05-08 ENCOUNTER — Encounter: Payer: Self-pay | Admitting: Physician Assistant

## 2016-05-13 ENCOUNTER — Telehealth: Payer: BLUE CROSS/BLUE SHIELD | Admitting: Family

## 2016-05-13 ENCOUNTER — Ambulatory Visit (INDEPENDENT_AMBULATORY_CARE_PROVIDER_SITE_OTHER): Payer: BLUE CROSS/BLUE SHIELD

## 2016-05-13 ENCOUNTER — Ambulatory Visit
Admission: EM | Admit: 2016-05-13 | Discharge: 2016-05-13 | Disposition: A | Discharge status: Home / Self Care | Payer: BLUE CROSS/BLUE SHIELD | Attending: Family Medicine | Admitting: Family Medicine

## 2016-05-13 DIAGNOSIS — K219 Gastro-esophageal reflux disease without esophagitis: Secondary | ICD-10-CM | POA: Diagnosis not present

## 2016-05-13 DIAGNOSIS — Z87891 Personal history of nicotine dependence: Secondary | ICD-10-CM | POA: Insufficient documentation

## 2016-05-13 DIAGNOSIS — Z79899 Other long term (current) drug therapy: Secondary | ICD-10-CM | POA: Insufficient documentation

## 2016-05-13 DIAGNOSIS — K920 Hematemesis: Secondary | ICD-10-CM

## 2016-05-13 HISTORY — DX: Gastro-esophageal reflux disease without esophagitis: K21.9

## 2016-05-13 LAB — FIBRIN DERIVATIVES D-DIMER (ARMC ONLY): FIBRIN DERIVATIVES D-DIMER (ARMC): 175 (ref 0–499)

## 2016-05-13 LAB — CBC
HCT: 39.6 % (ref 35.0–47.0)
Hemoglobin: 13.4 g/dL (ref 12.0–16.0)
MCH: 30.6 pg (ref 26.0–34.0)
MCHC: 33.8 g/dL (ref 32.0–36.0)
MCV: 90.4 fL (ref 80.0–100.0)
PLATELETS: 270 10*3/uL (ref 150–440)
RBC: 4.39 MIL/uL (ref 3.80–5.20)
RDW: 12.2 % (ref 11.5–14.5)
WBC: 10.4 10*3/uL (ref 3.6–11.0)

## 2016-05-13 MED ORDER — PANTOPRAZOLE SODIUM 40 MG PO TBEC: 40.0000 mg | DELAYED_RELEASE_TABLET | Freq: Every day | ORAL | 1 refills | Status: DC

## 2016-05-13 NOTE — ED Triage Notes (Signed)
Pt has history of acid reflux, she ate kinda late last night and took Prilosec before bed however she woke up around 11 and had burning and coughing. She c/o difficulty taking deep breaths and scratchy throat.

## 2016-05-13 NOTE — Progress Notes (Signed)
Based on what you shared with me it looks like you have a serious condition that should be evaluated in a face to face office visit.  If you are having a true medical emergency please call 911.  If you need an urgent face to face visit, [facility] has four urgent care centers for your convenience.  If you need care fast and have a high deductible or no insurance consider:   [email or web address]  [phone]  [address] [place], [place] [address] 8 am to 8 pm Monday-Friday 10 am to 4 pm Saturday-Sunday   The following sites will take your  insurance:    . [facility] [place] a Provider at this Location  [address] [place], [place] [address] . 10 am to 8 pm Monday-Friday . 12 pm to 8 pm Saturday-Sunday   . [facility] at [place] a Provider at this Location  [address], [place], [place] [address] . 8 am to 8 pm Monday-Friday . 9 am to 6 pm Saturday . 11 am to 6 pm Sunday   . [facility] [place] Get Driving Directions  [address] [place], [place] [address] . 8 am to 8 pm Monday-Friday . 8 am to 4 pm Saturday-Sunday   . [facility] (walk-ins welcome, or call for a scheduled time)  [phone]  Get Driving Directions Find a Provider at this Location  [place],  [address] . 8 am to 8:30 pm Monday-Thursday . 8 am to 6 pm Friday . 8 am to 4 pm Saturday-Sunday   Your e-visit answers were reviewed by a board certified advanced clinical practitioner to complete your personal care plan.  Thank you for using e-Visits.

## 2016-05-13 NOTE — ED Provider Notes (Signed)
[facility]    CSN: [id number] Arrival date & time: 05/13/16  1155  History   Chief Complaint Chief Complaint  Patient presents with  . Gastroesophageal Reflux   HPI  45 year old female with a history of reflux, chronic pelvic pain secondary to endometriosis, migraine presents with complaints of chest pain, cough, shortness of breath.  Patient states that last night she ate dinner at a [employer]. She had barbecue, stew and chocolate cake. She states that she took her Prilosec as she normally does. She states that she got up in the night with chest tightness, burning in the throat. She states that she had some nausea as well. She then felt as if she was going to vomit. She heaved at the toilet numerous times. She states that she produced some mucus that was blood-tinged. No reports of vomiting food material or other stomach contents. She states that she subsequently back to bed. This morning she has had continued chest tightness. She states that her throat feels sore. She states that she's also had some shortness of breath. She states that when she walked in from the parking lot to our facility she was winded and this is not typical for her. She is on birth control. She has a family history of cardiac disease in her paternal grandmother. No known relieving factors. No other associated symptoms. No other complaints at this time.  Past Medical History:  Diagnosis Date  . Acid reflux   . Anxiety   . Menstrual migraine 11/07/2015   Patient Active Problem List   Diagnosis Date Noted  . Chronic pelvic pain in female 05/01/2016  . Dyspareunia in female 05/01/2016  . Abnormal uterine bleeding 05/01/2016  . Body mass index (BMI) of 30.0-30.9 in adult 11/24/2015  . Obesity 11/24/2015  . Migraine with aura and without status migrainosus, not intractable 11/24/2015  . Menstrual migraine 11/07/2015  . Cannot sleep 09/28/2015  . Pre-diabetes 03/21/2015  . Fatigue 03/21/2015    . Endometriosis determined by laparoscopy 01/19/2015  . Depression 01/17/2015   Past Surgical History:  Procedure Laterality Date  . DILATION AND CURETTAGE OF UTERUS    . laparoscopy     OB History    Gravida Para Term Preterm AB Living   3 2 2   1 2    SAB TAB Ectopic Multiple Live Births   1       2      Home Medications    Prior to Admission medications   Medication Sig Start Date End Date Taking? Authorizing Provider  buPROPion (WELLBUTRIN XL) 300 MG 24 hr tablet Take 1 tablet (300 mg total) by mouth daily. 12/12/15  Yes Mar Daring, PA-C  butalbital-acetaminophen-caffeine (FIORICET, ESGIC) [phone] MG tablet Take 1 tablet by mouth 2 (two) times daily as needed for headache. 11/24/15   Mar Daring, PA-C  JUNEL FE 1/20 1-20 MG-MCG tablet TAKE 1 TABLET BY MOUTH DAILY. 03/28/16   Mar Daring, PA-C  pantoprazole (PROTONIX) 40 MG tablet Take 1 tablet (40 mg total) by mouth daily. 05/13/16   Coral Spikes, DO  zolpidem (AMBIEN CR) 6.25 MG CR tablet Take 1 tablet (6.25 mg total) by mouth at bedtime as needed for sleep. 11/24/15   Mar Daring, PA-C   Family History Family History  Problem Relation Age of Onset  . Breast cancer Mother   . Lupus Father   . Diabetes Father   . Heart disease Paternal Grandmother   . Ovarian cancer  Neg Hx   . Colon cancer Neg Hx    Social History Social History  Substance Use Topics  . Smoking status: Former Smoker    Quit date: 08/06/2006  . Smokeless tobacco: Never Used     Comment: QUIT IN 2008  . Alcohol use 0.0 oz/week     Comment: OCCASIONALLY   Allergies   Review of patient's allergies indicates no known allergies.  Review of Systems Review of Systems  Constitutional: Negative.   HENT: Positive for sore throat.   Respiratory: Positive for chest tightness and shortness of breath.   Gastrointestinal: Negative.   All other systems reviewed and are negative.  Physical Exam Triage Vital Signs ED Triage  Vitals  Enc Vitals Group     BP 05/13/16 1213 112/74     Pulse Rate 05/13/16 1213 92     Resp 05/13/16 1213 18     Temp 05/13/16 1213 98 F (36.7 C)     Temp Source 05/13/16 1213 Oral     SpO2 05/13/16 1213 98 %     Weight 05/13/16 1211 165 lb (74.8 kg)     Height 05/13/16 1211 5' 3"  (1.6 m)     Head Circumference --      Peak Flow --      Pain Score 05/13/16 1212 6     Pain Loc --      Pain Edu? --      Excl. in [place]? --    Updated Vital Signs BP 112/74 (BP Location: Right Arm)   Pulse 92   Temp 98 F (36.7 C) (Oral)   Resp 18   Ht 5' 3"  (1.6 m)   Wt 165 lb (74.8 kg)   LMP 04/23/2016   SpO2 98%   BMI 29.23 kg/m   Physical Exam  Constitutional: She is oriented to person, place, and time. She appears well-developed. No distress.  HENT:  Head: Normocephalic and atraumatic.  Mouth/Throat: Oropharynx is clear and moist.  Eyes: Conjunctivae are normal.  Neck: Neck supple.  Cardiovascular: Normal rate and regular rhythm.   Pulmonary/Chest: Effort normal. She has no wheezes. She has no rales.  Abdominal: Soft. She exhibits no distension. There is no tenderness. There is no rebound and no guarding.  Musculoskeletal: Normal range of motion.  Neurological: She is alert and oriented to person, place, and time.  Skin: Skin is warm. No rash noted.  Psychiatric: She has a normal mood and affect.  Vitals reviewed.  UC Treatments / Results  Labs (all labs ordered are listed, but only abnormal results are displayed) Labs Reviewed  CBC  FIBRIN DERIVATIVES D-DIMER ([place])    EKG   Date: 05/13/2016  EKG Time: 1:36 PM  Rate: 90  Rhythm: normal sinus rhythm  Axis: Normal.  Intervals: Normal.  ST&T Change: T wave inversion in lead 3.  Narrative Interpretation: Normal sinus rhythm with a rate of 90. T-wave inversion noted in lead 3. This is nonspecific as its only and one lead.  Radiology Dg Chest 2 View  Result Date: 05/13/2016 CLINICAL DATA:  Shortness of breath and  chest pain for 1 day EXAM: CHEST  2 VIEW COMPARISON:  None. FINDINGS: Lungs are clear. Heart size and pulmonary vascularity are normal. No adenopathy. No pneumothorax. No bone lesions. IMPRESSION: No edema or consolidation. Electronically Signed   By: Lowella Grip III M.D.   On: 05/13/2016 12:54    Procedures Procedures (including critical care time)  Medications Ordered in UC Medications - No  data to display  Initial Impression / Assessment and Plan / UC Course  I have reviewed the triage vital signs and the nursing notes.  Pertinent labs & imaging results that were available during my care of the patient were reviewed by me and considered in my medical decision making (see chart for details).  Clinical Course  45 year old female presents today with complaints of chest tightness, shortness of breath, throat irritation. Clinically I suspect that this is all from uncontrolled reflux following a large meal last night. However, given reports of chest tightness and shortness of breath obtaining chest x-ray and EKG. Given the fact that patient is on birth control and is having some shortness of breath, obtaining d-dimer.  1255 PM - Chest x-ray returned negative. EKG unremarkable. Awaiting blood work.  135 PM - CBC normal. D-dimer normal. Discharging home with Rx for Protonix.  Final Clinical Impressions(s) / UC Diagnoses   Final diagnoses:  Gastroesophageal reflux disease, esophagitis presence not specified   New Prescriptions New Prescriptions   PANTOPRAZOLE (PROTONIX) 40 MG TABLET    Take 1 tablet (40 mg total) by mouth daily.     Coral Spikes, DO 05/13/16 1336

## 2016-05-13 NOTE — Discharge Instructions (Signed)
This is likely from gastritis/GERD.  Chest xray was normal. EKG was as well. CBC normal.  Take the protonix as prescribed.   Follow up with your PCP if you worsen or if you fail to improve.  Take care  Dr. Lacinda Axon

## 2016-05-22 ENCOUNTER — Encounter: Payer: Self-pay | Admitting: Obstetrics and Gynecology

## 2016-06-13 ENCOUNTER — Ambulatory Visit (INDEPENDENT_AMBULATORY_CARE_PROVIDER_SITE_OTHER): Payer: BLUE CROSS/BLUE SHIELD | Admitting: Obstetrics and Gynecology

## 2016-06-13 ENCOUNTER — Encounter: Payer: Self-pay | Admitting: Obstetrics and Gynecology

## 2016-06-13 VITALS — BP 108/70 | HR 84 | Wt 167.1 lb

## 2016-06-13 DIAGNOSIS — Z01818 Encounter for other preprocedural examination: Secondary | ICD-10-CM

## 2016-06-13 DIAGNOSIS — G8929 Other chronic pain: Secondary | ICD-10-CM

## 2016-06-13 DIAGNOSIS — R102 Pelvic and perineal pain unspecified side: Secondary | ICD-10-CM

## 2016-06-13 DIAGNOSIS — N939 Abnormal uterine and vaginal bleeding, unspecified: Secondary | ICD-10-CM

## 2016-06-13 DIAGNOSIS — N809 Endometriosis, unspecified: Secondary | ICD-10-CM

## 2016-06-13 DIAGNOSIS — N941 Unspecified dyspareunia: Secondary | ICD-10-CM

## 2016-06-13 NOTE — H&P (Signed)
Subjective:  PREOPERATIVE HISTORY AND PHYSICAL    Date of surgery: 06/18/2016 Procedure: LAVH bilateral salpingectomy    Patient is a 45 y.o. G3P208fmale scheduled for LAVH bilateral salpingectomy on 06/18/2016 due to history of symptomatic endometriosis and uterine fibroids.  [facility] Low Loestrin OCP therapy for 1.5 years; patient develops significant" crazy" side effects Loestrin 1/20 has been used for the past 6 months; still with "crazy" side effects; taking the pill continuously with sporadic bleeding as well as regular cycles (crank case type oil); she has noted recurrence of headaches on this OCP  Endometriosis pain: Bilateral pelvic stabbing and shooting pains which radiates from the adnexal regions in towards the vagina; also experiences low back pain with radiation into the right lower quadrant and right hip and thigh; is experiencing worsening deep thrusting dyspareunia  Patient is finished with childbearing  Gynecologic History Patient's last menstrual period was 04/23/2016. Contraception: OCP (estrogen/progesterone)   Menstrual History: OB History    Gravida Para Term Preterm AB Living   3 2 2   1 2    SAB TAB Ectopic Multiple Live Births   1       2       LMP recorded.    Past Medical History:  Diagnosis Date  . Acid reflux   . Anxiety   . Menstrual migraine 11/07/2015    Past Surgical History:  Procedure Laterality Date  . DILATION AND CURETTAGE OF UTERUS    . laparoscopy      OB History  Gravida Para Term Preterm AB Living  3 2 2   1 2   SAB TAB Ectopic Multiple Live Births  1       2    # Outcome Date GA Lbr Len/2nd Weight Sex Delivery Anes PTL Lv  3 Term 2007   10 lb (4.536 kg) M Vag-Spont   LIV  2 SAB 2006          1 Term 1998   8 lb 1.9 oz (3.683 kg) M Vag-Spont   LIV      Social History   Social History  . Marital status: Married    Spouse name: N/A  . Number of  children: N/A  . Years of education: N/A   Social History Main Topics  . Smoking status: Former Smoker    Quit date: 08/06/2006  . Smokeless tobacco: Never Used     Comment: QUIT IN 2008  . Alcohol use 0.0 oz/week     Comment: OCCASIONALLY  . Drug use: No  . Sexual activity: Yes    Birth control/ protection: Pill   Other Topics Concern  . None   Social History Narrative  . None    Family History  Problem Relation Age of Onset  . Breast cancer Mother   . Lupus Father   . Diabetes Father   . Heart disease Paternal Grandmother   . Ovarian cancer Neg Hx   . Colon cancer Neg Hx      (Not in a hospital admission)  No Known Allergies  Review of Systems Constitutional: No recent fever/chills/sweats Respiratory: No recent cough/bronchitis Cardiovascular: No chest pain Gastrointestinal: No recent nausea/vomiting/diarrhea Genitourinary: No UTI symptoms Hematologic/lymphatic:No history of coagulopathy or recent blood thinner use    Objective:    BP 108/70   Pulse 84   Wt 167 lb 1.6 oz (75.8 kg)   BMI 29.60 kg/m   General:   Normal  Skin:  normal  HEENT:  Normal  Neck:  Supple without Adenopathy or Thyromegaly  Lungs:   Heart:              Breasts:   Abdomen:  Pelvis:  M/S   Extremeties:  Neuro:    clear to auscultation bilaterally   Normal without murmur   Not Examined   soft, non-tender; bowel sounds normal; no masses,  no organomegaly   Exam deferred to OR  No CVAT  Warm/Dry   Normal       05/01/2016 PELVIC:             External Genitalia: Normal             BUS: Normal             Vagina: Normal             Cervix: Normal; 1/4 cervical motion tenderness             Uterus: Normal size, shape,consistency, mobile; midplane to retroverted; 2/4 tender             Adnexa: Normal; nonpalpable nontender             RV: Normal external exam             Bladder: Nontender  Assessment:    1. Symptomatic endometriosis, declines medical therapies 2.  Uterine fibroids    Plan:   LAVH bilateral salpingectomy    preop counseling: Patient has been counseled regarding the upcoming surgery on 06/18/2016. She is to undergo laparoscopic assisted vaginal hysterectomy with bilateral salpingectomy. She is understanding of the planned procedure and is aware of and is accepting of all surgical risks which include but are not limited to bleeding, infection, pelvic organ injury with need for repair, blood clot disorders, anesthesia risks, etc. All questions have been answered. Informed consent is given. Patient is ready and willing to proceed with surgery as scheduled.   Brayton Mars, MD  Note: This dictation was prepared with Dragon dictation along with smaller phrase technology. Any transcriptional errors that result from this process are unintentional.

## 2016-06-13 NOTE — Patient Instructions (Signed)
  Your procedure is scheduled on: 06-18-16 [facility]) Report to Same Day Surgery 2nd floor medical mall To find out your arrival time please call [phone] between [place] on 06-15-16 (FRIDAY)  Remember: Instructions that are not followed completely may result in serious medical risk, up to and including death, or upon the discretion of your surgeon and anesthesiologist your surgery may need to be rescheduled.    _x___ 1. Do not eat food or drink liquids after midnight. No gum chewing or hard candies.     __x__ 2. No Alcohol for 24 hours before or after surgery.   __x__3. No Smoking for 24 prior to surgery.   ____  4. Bring all medications with you on the day of surgery if instructed.    __x__ 5. Notify your doctor if there is any change in your medical condition     (cold, fever, infections).     Do not wear jewelry, make-up, hairpins, clips or nail polish.  Do not wear lotions, powders, or perfumes. You may wear deodorant.  Do not shave 48 hours prior to surgery. Men may shave face and neck.  Do not bring valuables to the hospital.    [facility] is not responsible for any belongings or valuables.               Contacts, dentures or bridgework may not be worn into surgery.  Leave your suitcase in the car. After surgery it may be brought to your room.  For patients admitted to the hospital, discharge time is determined by your treatment team.   Patients discharged the day of surgery will not be allowed to drive home.    Please read over the following fact sheets that you were given:   [facility] Preparing for Surgery and or MRSA Information   _x___ Take these medicines the morning of surgery with A SIP OF WATER:    1. PROTONIX  2. TAKE AN EXTRA PROTONIX ON Sunday NIGHT BEFORE BED(06-17-16)  3.  4.  5.  6.  ____Fleets enema or Magnesium Citrate as directed.   _x___ Use CHG Soap or sage wipes as directed on instruction sheet   ____ Use inhalers on the day of surgery  and bring to hospital day of surgery  ____ Stop metformin 2 days prior to surgery    ____ Take 1/2 of usual insulin dose the night before surgery and none on the morning of  surgery.   ____ Stop aspirin or coumadin, or plavix  x__ Stop Anti-inflammatories such as Advil, Aleve, Ibuprofen, Motrin, Naproxen,          Naprosyn, Goodies powders or aspirin products-STOP IBUPROFEN NOW-Ok to take Tylenol.   ____ Stop supplements until after surgery.    ____ Bring C-Pap to the hospital.

## 2016-06-13 NOTE — Patient Instructions (Signed)
1. Return in 1 week after surgery for postop check

## 2016-06-13 NOTE — Progress Notes (Signed)
Subjective:  PREOPERATIVE HISTORY AND PHYSICAL    Date of surgery: 06/18/2016 Procedure: LAVH bilateral salpingectomy    Patient is a 45 y.o. G3P2047fmale scheduled for LAVH bilateral salpingectomy on 06/18/2016 due to history of symptomatic endometriosis and uterine fibroids.  Endometri[facility] 2014 [facility] Low Loestrin OCP therapy for 1.5 years; patient develops significant" crazy" side effects Loestrin 1/20 has been used for the past 6 months; still with "crazy" side effects; taking the pill continuously with sporadic bleeding as well as regular cycles (crank case type oil); she has noted recurrence of headaches on this OCP  Endometriosis pain: Bilateral pelvic stabbing and shooting pains which radiates from the adnexal regions in towards the vagina; also experiences low back pain with radiation into the right lower quadrant and right hip and thigh; is experiencing worsening deep thrusting dyspareunia  Patient is finished with childbearing  Gynecologic History Patient's last menstrual period was 04/23/2016. Contraception: OCP (estrogen/progesterone)   Menstrual History: OB History    Gravida Para Term Preterm AB Living   3 2 2   1 2    SAB TAB Ectopic Multiple Live Births   1       2       LMP recorded.    Past Medical History:  Diagnosis Date  . Acid reflux   . Anxiety   . Menstrual migraine 11/07/2015    Past Surgical History:  Procedure Laterality Date  . DILATION AND CURETTAGE OF UTERUS    . laparoscopy      OB History  Gravida Para Term Preterm AB Living  3 2 2   1 2   SAB TAB Ectopic Multiple Live Births  1       2    # Outcome Date GA Lbr Len/2nd Weight Sex Delivery Anes PTL Lv  3 Term 2007   10 lb (4.536 kg) M Vag-Spont   LIV  2 SAB 2006          1 Term 1998   8 lb 1.9 oz (3.683 kg) M Vag-Spont   LIV      Social History   Social History  . Marital status: Married    Spouse name: N/A  . Number of  children: N/A  . Years of education: N/A   Social History Main Topics  . Smoking status: Former Smoker    Quit date: 08/06/2006  . Smokeless tobacco: Never Used     Comment: QUIT IN 2008  . Alcohol use 0.0 oz/week     Comment: OCCASIONALLY  . Drug use: No  . Sexual activity: Yes    Birth control/ protection: Pill   Other Topics Concern  . None   Social History Narrative  . None    Family History  Problem Relation Age of Onset  . Breast cancer Mother   . Lupus Father   . Diabetes Father   . Heart disease Paternal Grandmother   . Ovarian cancer Neg Hx   . Colon cancer Neg Hx      (Not in a hospital admission)  No Known Allergies  Review of Systems Constitutional: No recent fever/chills/sweats Respiratory: No recent cough/bronchitis Cardiovascular: No chest pain Gastrointestinal: No recent nausea/vomiting/diarrhea Genitourinary: No UTI symptoms Hematologic/lymphatic:No history of coagulopathy or recent blood thinner use    Objective:    BP 108/70   Pulse 84   Wt 167 lb 1.6 oz (75.8 kg)   BMI 29.60 kg/m   General:   Normal  Skin:  normal  HEENT:  Normal  Neck:  Supple without Adenopathy or Thyromegaly  Lungs:   Heart:              Breasts:   Abdomen:  Pelvis:  M/S   Extremeties:  Neuro:    clear to auscultation bilaterally   Normal without murmur   Not Examined   soft, non-tender; bowel sounds normal; no masses,  no organomegaly   Exam deferred to OR  No CVAT  Warm/Dry   Normal       05/01/2016 PELVIC:             External Genitalia: Normal             BUS: Normal             Vagina: Normal             Cervix: Normal; 1/4 cervical motion tenderness             Uterus: Normal size, shape,consistency, mobile; midplane to retroverted; 2/4 tender             Adnexa: Normal; nonpalpable nontender             RV: Normal external exam             Bladder: Nontender  Assessment:    1. Symptomatic endometriosis, declines medical therapies 2.  Uterine fibroids    Plan:   LAVH bilateral salpingectomy    preop counseling: Patient has been counseled regarding the upcoming surgery on 06/18/2016. She is to undergo laparoscopic assisted vaginal hysterectomy with bilateral salpingectomy. She is understanding of the planned procedure and is aware of and is accepting of all surgical risks which include but are not limited to bleeding, infection, pelvic organ injury with need for repair, blood clot disorders, anesthesia risks, etc. All questions have been answered. Informed consent is given. Patient is ready and willing to proceed with surgery as scheduled.   Brayton Mars, MD  Note: This dictation was prepared with Dragon dictation along with smaller phrase technology. Any transcriptional errors that result from this process are unintentional.

## 2016-06-15 ENCOUNTER — Encounter
Admission: RE | Admit: 2016-06-15 | Discharge: 2016-06-15 | Disposition: A | Discharge status: Home / Self Care | Payer: BLUE CROSS/BLUE SHIELD | Source: Ambulatory Visit | Attending: Obstetrics and Gynecology | Admitting: Obstetrics and Gynecology

## 2016-06-15 DIAGNOSIS — N939 Abnormal uterine and vaginal bleeding, unspecified: Secondary | ICD-10-CM | POA: Insufficient documentation

## 2016-06-15 DIAGNOSIS — F419 Anxiety disorder, unspecified: Secondary | ICD-10-CM | POA: Diagnosis not present

## 2016-06-15 DIAGNOSIS — Z0183 Encounter for blood typing: Secondary | ICD-10-CM

## 2016-06-15 DIAGNOSIS — F329 Major depressive disorder, single episode, unspecified: Secondary | ICD-10-CM | POA: Diagnosis not present

## 2016-06-15 DIAGNOSIS — R102 Pelvic and perineal pain: Secondary | ICD-10-CM

## 2016-06-15 DIAGNOSIS — N809 Endometriosis, unspecified: Secondary | ICD-10-CM

## 2016-06-15 DIAGNOSIS — Z01812 Encounter for preprocedural laboratory examination: Secondary | ICD-10-CM | POA: Insufficient documentation

## 2016-06-15 DIAGNOSIS — Z87891 Personal history of nicotine dependence: Secondary | ICD-10-CM | POA: Diagnosis not present

## 2016-06-15 DIAGNOSIS — N941 Unspecified dyspareunia: Secondary | ICD-10-CM

## 2016-06-15 DIAGNOSIS — K219 Gastro-esophageal reflux disease without esophagitis: Secondary | ICD-10-CM | POA: Diagnosis not present

## 2016-06-15 DIAGNOSIS — D259 Leiomyoma of uterus, unspecified: Secondary | ICD-10-CM | POA: Diagnosis not present

## 2016-06-15 LAB — CBC WITH DIFFERENTIAL/PLATELET
BASOS ABS: 0 10*3/uL (ref 0–0.1)
Basophils Relative: 0 %
EOS PCT: 0 %
Eosinophils Absolute: 0 10*3/uL (ref 0–0.7)
HCT: 40.3 % (ref 35.0–47.0)
Hemoglobin: 14 g/dL (ref 12.0–16.0)
LYMPHS ABS: 1.5 10*3/uL (ref 1.0–3.6)
LYMPHS PCT: 11 %
MCH: 31.4 pg (ref 26.0–34.0)
MCHC: 34.7 g/dL (ref 32.0–36.0)
MCV: 90.4 fL (ref 80.0–100.0)
MONO ABS: 0.5 10*3/uL (ref 0.2–0.9)
Monocytes Relative: 4 %
Neutro Abs: 11 10*3/uL — ABNORMAL HIGH (ref 1.4–6.5)
Neutrophils Relative %: 85 %
PLATELETS: 272 10*3/uL (ref 150–440)
RBC: 4.45 MIL/uL (ref 3.80–5.20)
RDW: 12.3 % (ref 11.5–14.5)
WBC: 13 10*3/uL — ABNORMAL HIGH (ref 3.6–11.0)

## 2016-06-15 LAB — TYPE AND SCREEN
ABO/RH(D): A NEG
Antibody Screen: NEGATIVE

## 2016-06-15 LAB — RAPID HIV SCREEN (HIV 1/2 AB+AG)
HIV 1/2 Antibodies: NONREACTIVE
HIV-1 P24 Antigen - HIV24: NONREACTIVE

## 2016-06-16 LAB — RPR: RPR: NONREACTIVE

## 2016-06-18 ENCOUNTER — Ambulatory Visit: Payer: BLUE CROSS/BLUE SHIELD | Admitting: Certified Registered Nurse Anesthetist

## 2016-06-18 ENCOUNTER — Observation Stay
Admission: RE | Admit: 2016-06-18 | Discharge: 2016-06-19 | Disposition: A | Discharge status: Home / Self Care | Payer: BLUE CROSS/BLUE SHIELD | Source: Ambulatory Visit | Attending: Obstetrics and Gynecology | Admitting: Obstetrics and Gynecology

## 2016-06-18 ENCOUNTER — Encounter
Admission: RE | Disposition: A | Discharge status: Home / Self Care | Payer: Self-pay | Source: Ambulatory Visit | Attending: Obstetrics and Gynecology

## 2016-06-18 ENCOUNTER — Encounter: Payer: Self-pay | Admitting: Obstetrics and Gynecology

## 2016-06-18 DIAGNOSIS — N941 Unspecified dyspareunia: Secondary | ICD-10-CM

## 2016-06-18 DIAGNOSIS — N809 Endometriosis, unspecified: Principal | ICD-10-CM | POA: Insufficient documentation

## 2016-06-18 DIAGNOSIS — Z90710 Acquired absence of both cervix and uterus: Secondary | ICD-10-CM | POA: Diagnosis present

## 2016-06-18 DIAGNOSIS — F329 Major depressive disorder, single episode, unspecified: Secondary | ICD-10-CM | POA: Insufficient documentation

## 2016-06-18 DIAGNOSIS — F419 Anxiety disorder, unspecified: Secondary | ICD-10-CM | POA: Insufficient documentation

## 2016-06-18 DIAGNOSIS — N939 Abnormal uterine and vaginal bleeding, unspecified: Secondary | ICD-10-CM

## 2016-06-18 DIAGNOSIS — K219 Gastro-esophageal reflux disease without esophagitis: Secondary | ICD-10-CM | POA: Insufficient documentation

## 2016-06-18 DIAGNOSIS — R102 Pelvic and perineal pain: Secondary | ICD-10-CM

## 2016-06-18 DIAGNOSIS — G8929 Other chronic pain: Secondary | ICD-10-CM

## 2016-06-18 DIAGNOSIS — Z87891 Personal history of nicotine dependence: Secondary | ICD-10-CM | POA: Insufficient documentation

## 2016-06-18 DIAGNOSIS — D259 Leiomyoma of uterus, unspecified: Secondary | ICD-10-CM | POA: Insufficient documentation

## 2016-06-18 HISTORY — PX: LAPAROSCOPIC ASSISTED VAGINAL HYSTERECTOMY: SHX5398

## 2016-06-18 LAB — POCT PREGNANCY, URINE: PREG TEST UR: NEGATIVE

## 2016-06-18 SURGERY — HYSTERECTOMY, VAGINAL, LAPAROSCOPY-ASSISTED
Anesthesia: General | Wound class: Clean Contaminated

## 2016-06-18 MED ORDER — FENTANYL CITRATE (PF) 100 MCG/2ML IJ SOLN
INTRAMUSCULAR | Status: DC | PRN
  Administered 2016-06-18 (×6): 50 ug via INTRAVENOUS

## 2016-06-18 MED ORDER — ACETAMINOPHEN 325 MG PO TABS: 650.0000 mg | ORAL_TABLET | ORAL | Status: DC | PRN

## 2016-06-18 MED ORDER — ROCURONIUM BROMIDE 100 MG/10ML IV SOLN
INTRAVENOUS | Status: DC | PRN
  Administered 2016-06-18: 50 mg via INTRAVENOUS
  Administered 2016-06-18: 10 mg via INTRAVENOUS

## 2016-06-18 MED ORDER — HYDROMORPHONE HCL 1 MG/ML IJ SOLN
INTRAMUSCULAR | Status: AC
  Administered 2016-06-18: 0.5 mg via INTRAVENOUS
  Filled 2016-06-18: qty 1

## 2016-06-18 MED ORDER — FENTANYL CITRATE (PF) 100 MCG/2ML IJ SOLN
INTRAMUSCULAR | Status: AC
  Administered 2016-06-18: 50 ug via INTRAVENOUS
  Filled 2016-06-18: qty 2

## 2016-06-18 MED ORDER — LIDOCAINE HCL (CARDIAC) 20 MG/ML IV SOLN
INTRAVENOUS | Status: DC | PRN
  Administered 2016-06-18: 80 mg via INTRAVENOUS

## 2016-06-18 MED ORDER — MIDAZOLAM HCL 2 MG/2ML IJ SOLN
INTRAMUSCULAR | Status: DC | PRN
  Administered 2016-06-18: 2 mg via INTRAVENOUS

## 2016-06-18 MED ORDER — EPHEDRINE SULFATE 50 MG/ML IJ SOLN
INTRAMUSCULAR | Status: DC | PRN
  Administered 2016-06-18: 5 mg via INTRAVENOUS

## 2016-06-18 MED ORDER — DEXAMETHASONE SODIUM PHOSPHATE 10 MG/ML IJ SOLN
INTRAMUSCULAR | Status: DC | PRN
  Administered 2016-06-18: 5 mg via INTRAVENOUS

## 2016-06-18 MED ORDER — CEFAZOLIN SODIUM-DEXTROSE 2-4 GM/100ML-% IV SOLN
INTRAVENOUS | Status: AC
  Filled 2016-06-18: qty 100

## 2016-06-18 MED ORDER — FENTANYL CITRATE (PF) 100 MCG/2ML IJ SOLN
25.0000 ug | INTRAMUSCULAR | Status: DC | PRN
  Administered 2016-06-18 (×3): 50 ug via INTRAVENOUS

## 2016-06-18 MED ORDER — OXYCODONE HCL 5 MG/5ML PO SOLN: 5.0000 mg | Freq: Once | ORAL | Status: DC | PRN

## 2016-06-18 MED ORDER — OXYCODONE HCL 5 MG PO TABS: 5.0000 mg | ORAL_TABLET | Freq: Once | ORAL | Status: DC | PRN

## 2016-06-18 MED ORDER — BISACODYL 10 MG RE SUPP: 10.0000 mg | Freq: Every day | RECTAL | Status: DC | PRN

## 2016-06-18 MED ORDER — MEPERIDINE HCL 25 MG/ML IJ SOLN: 6.2500 mg | INTRAMUSCULAR | Status: DC | PRN

## 2016-06-18 MED ORDER — ONDANSETRON HCL 4 MG/2ML IJ SOLN
INTRAMUSCULAR | Status: DC | PRN
  Administered 2016-06-18: 4 mg via INTRAVENOUS

## 2016-06-18 MED ORDER — LACTATED RINGERS IV SOLN
INTRAVENOUS | Status: DC
  Administered 2016-06-18 – 2016-06-19 (×2): via INTRAVENOUS

## 2016-06-18 MED ORDER — KETOROLAC TROMETHAMINE 30 MG/ML IJ SOLN
INTRAMUSCULAR | Status: DC | PRN
  Administered 2016-06-18: 30 mg via INTRAVENOUS

## 2016-06-18 MED ORDER — ACETAMINOPHEN 10 MG/ML IV SOLN
INTRAVENOUS | Status: DC | PRN
  Administered 2016-06-18: 1000 mg via INTRAVENOUS

## 2016-06-18 MED ORDER — PROPOFOL 10 MG/ML IV BOLUS
INTRAVENOUS | Status: DC | PRN
  Administered 2016-06-18: 150 mg via INTRAVENOUS

## 2016-06-18 MED ORDER — SUGAMMADEX SODIUM 200 MG/2ML IV SOLN
INTRAVENOUS | Status: DC | PRN
  Administered 2016-06-18: 149.6 mg via INTRAVENOUS

## 2016-06-18 MED ORDER — PROMETHAZINE HCL 25 MG/ML IJ SOLN: 6.2500 mg | INTRAMUSCULAR | Status: DC | PRN

## 2016-06-18 MED ORDER — SIMETHICONE 80 MG PO CHEW: 80.0000 mg | CHEWABLE_TABLET | Freq: Four times a day (QID) | ORAL | Status: DC | PRN

## 2016-06-18 MED ORDER — KETOROLAC TROMETHAMINE 30 MG/ML IJ SOLN: 30.0000 mg | Freq: Four times a day (QID) | INTRAMUSCULAR | Status: DC

## 2016-06-18 MED ORDER — DOCUSATE SODIUM 100 MG PO CAPS
100.0000 mg | ORAL_CAPSULE | Freq: Two times a day (BID) | ORAL | Status: DC
  Administered 2016-06-18 – 2016-06-19 (×2): 100 mg via ORAL
  Filled 2016-06-18 (×2): qty 1

## 2016-06-18 MED ORDER — MORPHINE SULFATE (PF) 4 MG/ML IV SOLN
1.0000 mg | INTRAVENOUS | Status: DC | PRN
  Administered 2016-06-18 (×3): 2 mg via INTRAVENOUS
  Filled 2016-06-18 (×3): qty 1

## 2016-06-18 MED ORDER — BUPROPION HCL ER (XL) 300 MG PO TB24
300.0000 mg | ORAL_TABLET | Freq: Every day | ORAL | Status: DC
  Administered 2016-06-19: 300 mg via ORAL
  Filled 2016-06-18 (×2): qty 1

## 2016-06-18 MED ORDER — KETOROLAC TROMETHAMINE 30 MG/ML IJ SOLN
30.0000 mg | Freq: Four times a day (QID) | INTRAMUSCULAR | Status: DC
  Administered 2016-06-18 – 2016-06-19 (×3): 30 mg via INTRAVENOUS
  Filled 2016-06-18 (×4): qty 1

## 2016-06-18 MED ORDER — OXYCODONE-ACETAMINOPHEN 5-325 MG PO TABS
1.0000 | ORAL_TABLET | ORAL | Status: DC | PRN
  Administered 2016-06-18: 2 via ORAL
  Administered 2016-06-19: 1 via ORAL
  Filled 2016-06-18 (×2): qty 2

## 2016-06-18 MED ORDER — LACTATED RINGERS IV SOLN: INTRAVENOUS | Status: DC

## 2016-06-18 MED ORDER — LACTATED RINGERS IV SOLN
INTRAVENOUS | Status: DC
  Administered 2016-06-18: 12:00:00 via INTRAVENOUS

## 2016-06-18 MED ORDER — CEFAZOLIN SODIUM-DEXTROSE 2-4 GM/100ML-% IV SOLN
2.0000 g | INTRAVENOUS | Status: AC
  Administered 2016-06-18: 2 g via INTRAVENOUS

## 2016-06-18 MED ORDER — HYDROMORPHONE HCL 1 MG/ML IJ SOLN
0.2500 mg | INTRAMUSCULAR | Status: DC | PRN
  Administered 2016-06-18 (×2): 0.5 mg via INTRAVENOUS

## 2016-06-18 MED ORDER — LACTATED RINGERS IV SOLN
INTRAVENOUS | Status: DC | PRN
  Administered 2016-06-18 (×2): via INTRAVENOUS

## 2016-06-18 SURGICAL SUPPLY — 46 items
BAG URO DRAIN 2000ML W/SPOUT (MISCELLANEOUS) ×2 IMPLANT
BLADE SURG SZ11 CARB STEEL (BLADE) ×2 IMPLANT
CATH FOLEY 2WAY  5CC 16FR (CATHETERS) ×1
CATH URTH 16FR FL 2W BLN LF (CATHETERS) ×1 IMPLANT
CHLORAPREP W/TINT 26ML (MISCELLANEOUS) ×2 IMPLANT
CORD MONOPOLAR M/FML 12FT (MISCELLANEOUS) ×2 IMPLANT
DERMABOND ADVANCED (GAUZE/BANDAGES/DRESSINGS) ×1
DERMABOND ADVANCED .7 DNX12 (GAUZE/BANDAGES/DRESSINGS) ×1 IMPLANT
DRSG TEGADERM 2X2.25 PEDS (GAUZE/BANDAGES/DRESSINGS) IMPLANT
ELECT REM PT RETURN 9FT ADLT (ELECTROSURGICAL) ×2
ELECTRODE REM PT RTRN 9FT ADLT (ELECTROSURGICAL) ×1 IMPLANT
GAUZE PETRO XEROFOAM 1X8 (MISCELLANEOUS) ×2 IMPLANT
GLOVE BIO SURGEON STRL SZ 6.5 (GLOVE) ×4 IMPLANT
GLOVE BIO SURGEON STRL SZ8 (GLOVE) ×10 IMPLANT
GLOVE INDICATOR 7.0 STRL GRN (GLOVE) ×2 IMPLANT
GOWN STRL REUS W/ TWL LRG LVL3 (GOWN DISPOSABLE) ×2 IMPLANT
GOWN STRL REUS W/ TWL XL LVL3 (GOWN DISPOSABLE) ×1 IMPLANT
GOWN STRL REUS W/TWL LRG LVL3 (GOWN DISPOSABLE) ×2
GOWN STRL REUS W/TWL XL LVL3 (GOWN DISPOSABLE) ×1
IRRIGATION STRYKERFLOW (MISCELLANEOUS) ×1 IMPLANT
IRRIGATOR STRYKERFLOW (MISCELLANEOUS) ×2
IV LACTATED RINGERS 1000ML (IV SOLUTION) ×2 IMPLANT
KIT PINK PAD W/HEAD ARE REST (MISCELLANEOUS) ×2
KIT PINK PAD W/HEAD ARM REST (MISCELLANEOUS) ×1 IMPLANT
KIT RM TURNOVER CYSTO AR (KITS) ×2 IMPLANT
LABEL OR SOLS (LABEL) ×2 IMPLANT
LIQUID BAND (GAUZE/BANDAGES/DRESSINGS) ×2 IMPLANT
PACK BASIN MINOR ARMC (MISCELLANEOUS) ×2 IMPLANT
PACK GYN LAPAROSCOPIC (MISCELLANEOUS) ×2 IMPLANT
PAD OB MATERNITY 4.3X12.25 (PERSONAL CARE ITEMS) ×2 IMPLANT
PENCIL ELECTRO HAND CTR (MISCELLANEOUS) ×2 IMPLANT
SCISSORS METZENBAUM CVD 33 (INSTRUMENTS) ×2 IMPLANT
SHEARS HARMONIC ACE PLUS 36CM (ENDOMECHANICALS) ×2 IMPLANT
SLEEVE ENDOPATH XCEL 5M (ENDOMECHANICALS) ×4 IMPLANT
SPONGE XRAY 4X4 16PLY STRL (MISCELLANEOUS) ×2 IMPLANT
SUT CHROMIC 2 0 CT 1 (SUTURE) ×8 IMPLANT
SUT MNCRL 4-0 (SUTURE) ×1
SUT MNCRL 4-0 27XMFL (SUTURE) ×1
SUT VIC AB 0 CT1 27 (SUTURE)
SUT VIC AB 0 CT1 27XCR 8 STRN (SUTURE) IMPLANT
SUT VIC AB 0 CT1 36 (SUTURE) ×2 IMPLANT
SUTURE MNCRL 4-0 27XMF (SUTURE) ×1 IMPLANT
SYRINGE 10CC LL (SYRINGE) ×2 IMPLANT
TAPE TRANSPORE STRL 2 31045 (GAUZE/BANDAGES/DRESSINGS) ×2 IMPLANT
TROCAR XCEL NON-BLD 5MMX100MML (ENDOMECHANICALS) ×2 IMPLANT
TUBING INSUFFLATOR HEATED (MISCELLANEOUS) ×2 IMPLANT

## 2016-06-18 NOTE — Anesthesia Postprocedure Evaluation (Signed)
Anesthesia Post Note  Patient: Moneka C Post  Procedure(s) Performed: Procedure(s) (LRB): LAPAROSCOPIC ASSISTED VAGINAL HYSTERECTOMY WITH BILATERAL SALPINGECTOMY (N/A)  Patient location during evaluation: PACU Anesthesia Type: General Level of consciousness: awake and alert and oriented Pain management: pain level controlled Vital Signs Assessment: post-procedure vital signs reviewed and stable Respiratory status: spontaneous breathing, nonlabored ventilation and respiratory function stable Cardiovascular status: blood pressure returned to baseline and stable Postop Assessment: no signs of nausea or vomiting Anesthetic complications: no    Last Vitals:  Vitals:   06/18/16 1452 06/18/16 1458  BP:  (!) 107/51  Pulse: 68 70  Resp: 11 14  Temp:      Last Pain:  Vitals:   06/18/16 1452  TempSrc:   PainSc: 5                  Nariah Morgano

## 2016-06-18 NOTE — H&P (View-Only) (Signed)
Subjective:  PREOPERATIVE HISTORY AND PHYSICAL    Date of surgery: 06/18/2016 Procedure: LAVH bilateral salpingectomy    Patient is a 45 y.o. G3P2061fmale scheduled for LAVH bilateral salpingectomy on 06/18/2016 due to history of symptomatic endometriosis and uterine fibroids.  Endometriosis [facility] in October 2014 [facility] Low Loestrin OCP therapy for 1.5 years; patient develops significant" crazy" side effects Loestrin 1/20 has been used for the past 6 months; still with "crazy" side effects; taking the pill continuously with sporadic bleeding as well as regular cycles (crank case type oil); she has noted recurrence of headaches on this OCP  Endometriosis pain: Bilateral pelvic stabbing and shooting pains which radiates from the adnexal regions in towards the vagina; also experiences low back pain with radiation into the right lower quadrant and right hip and thigh; is experiencing worsening deep thrusting dyspareunia  Patient is finished with childbearing  Gynecologic History Patient's last menstrual period was 04/23/2016. Contraception: OCP (estrogen/progesterone)   Menstrual History: OB History    Gravida Para Term Preterm AB Living   3 2 2   1 2    SAB TAB Ectopic Multiple Live Births   1       2       LMP recorded.    Past Medical History:  Diagnosis Date  . Acid reflux   . Anxiety   . Menstrual migraine 11/07/2015    Past Surgical History:  Procedure Laterality Date  . DILATION AND CURETTAGE OF UTERUS    . laparoscopy      OB History  Gravida Para Term Preterm AB Living  3 2 2   1 2   SAB TAB Ectopic Multiple Live Births  1       2    # Outcome Date GA Lbr Len/2nd Weight Sex Delivery Anes PTL Lv  3 Term 2007   10 lb (4.536 kg) M Vag-Spont   LIV  2 SAB 2006          1 Term 1998   8 lb 1.9 oz (3.683 kg) M Vag-Spont   LIV      Social History   Social History  . Marital status: Married    Spouse name: N/A  . Number of  children: N/A  . Years of education: N/A   Social History Main Topics  . Smoking status: Former Smoker    Quit date: 08/06/2006  . Smokeless tobacco: Never Used     Comment: QUIT IN 2008  . Alcohol use 0.0 oz/week     Comment: OCCASIONALLY  . Drug use: No  . Sexual activity: Yes    Birth control/ protection: Pill   Other Topics Concern  . None   Social History Narrative  . None    Family History  Problem Relation Age of Onset  . Breast cancer Mother   . Lupus Father   . Diabetes Father   . Heart disease Paternal Grandmother   . Ovarian cancer Neg Hx   . Colon cancer Neg Hx      (Not in a hospital admission)  No Known Allergies  Review of Systems Constitutional: No recent fever/chills/sweats Respiratory: No recent cough/bronchitis Cardiovascular: No chest pain Gastrointestinal: No recent nausea/vomiting/diarrhea Genitourinary: No UTI symptoms Hematologic/lymphatic:No history of coagulopathy or recent blood thinner use    Objective:    BP 108/70   Pulse 84   Wt 167 lb 1.6 oz (75.8 kg)   BMI 29.60 kg/m   General:   Normal  Skin:  normal  HEENT:  Normal  Neck:  Supple without Adenopathy or Thyromegaly  Lungs:   Heart:              Breasts:   Abdomen:  Pelvis:  M/S   Extremeties:  Neuro:    clear to auscultation bilaterally   Normal without murmur   Not Examined   soft, non-tender; bowel sounds normal; no masses,  no organomegaly   Exam deferred to OR  No CVAT  Warm/Dry   Normal       05/01/2016 PELVIC:             External Genitalia: Normal             BUS: Normal             Vagina: Normal             Cervix: Normal; 1/4 cervical motion tenderness             Uterus: Normal size, shape,consistency, mobile; midplane to retroverted; 2/4 tender             Adnexa: Normal; nonpalpable nontender             RV: Normal external exam             Bladder: Nontender  Assessment:    1. Symptomatic endometriosis, declines medical therapies 2.  Uterine fibroids    Plan:   LAVH bilateral salpingectomy    preop counseling: Patient has been counseled regarding the upcoming surgery on 06/18/2016. She is to undergo laparoscopic assisted vaginal hysterectomy with bilateral salpingectomy. She is understanding of the planned procedure and is aware of and is accepting of all surgical risks which include but are not limited to bleeding, infection, pelvic organ injury with need for repair, blood clot disorders, anesthesia risks, etc. All questions have been answered. Informed consent is given. Patient is ready and willing to proceed with surgery as scheduled.   Brayton Mars, MD  Note: This dictation was prepared with Dragon dictation along with smaller phrase technology. Any transcriptional errors that result from this process are unintentional.

## 2016-06-18 NOTE — Transfer of Care (Signed)
Immediate Anesthesia Transfer of Care Note  Patient: Vicki Reese  Procedure(s) Performed: Procedure(s): LAPAROSCOPIC ASSISTED VAGINAL HYSTERECTOMY WITH BILATERAL SALPINGECTOMY (N/A)  Patient Location: PACU  Anesthesia Type:General  Level of Consciousness: awake, alert  and oriented  Airway & Oxygen Therapy: Patient Spontanous Breathing and Patient connected to face mask oxygen  Reese-op Assessment: Report given to RN and Reese -op Vital signs reviewed and stable  Reese vital signs: Reviewed and stable  Last Vitals:  Vitals:   06/18/16 1050  BP: 124/88  Resp: 16  Temp: 37.1 C    Last Pain:  Vitals:   06/18/16 1050  TempSrc: Oral         Complications: No apparent anesthesia complications

## 2016-06-18 NOTE — Anesthesia Procedure Notes (Addendum)
Procedure Name: Intubation Performed by: Alani Lacivita Pre-anesthesia Checklist: Patient identified, Patient being monitored, Timeout performed, Emergency Drugs available and Suction available Patient Re-evaluated:Patient Re-evaluated prior to inductionOxygen Delivery Method: Circle system utilized Preoxygenation: Pre-oxygenation with 100% oxygen Intubation Type: IV induction Ventilation: Mask ventilation without difficulty Laryngoscope Size: Mac and 3 Grade View: Grade I Tube type: Oral Tube size: 7.0 mm Number of attempts: 1 Airway Equipment and Method: Stylet Placement Confirmation: ETT inserted through vocal cords under direct vision,  positive ETCO2 and breath sounds checked- equal and bilateral Secured at: 21 cm Tube secured with: Tape Dental Injury: Teeth and Oropharynx as per pre-operative assessment        

## 2016-06-18 NOTE — Anesthesia Preprocedure Evaluation (Signed)
Anesthesia Evaluation  Patient identified by MRN, date of birth, ID band Patient awake    Reviewed: Allergy & Precautions, NPO status , Patient's Chart, lab work & pertinent test results  History of Anesthesia Complications Negative for: history of anesthetic complications  Airway Mallampati: II  TM Distance: >3 FB Neck ROM: Full    Dental no notable dental hx.    Pulmonary neg sleep apnea, neg COPD, former smoker,    breath sounds clear to auscultation- rhonchi (-) wheezing      Cardiovascular Exercise Tolerance: Good (-) hypertension(-) CAD and (-) Past MI  Rhythm:Regular Rate:Normal - Systolic murmurs and - Diastolic murmurs    Neuro/Psych  Headaches, Anxiety Depression    GI/Hepatic Neg liver ROS, GERD  ,  Endo/Other  negative endocrine ROSneg diabetes  Renal/GU negative Renal ROS     Musculoskeletal negative musculoskeletal ROS (+)   Abdominal (+) - obese,   Peds  Hematology negative hematology ROS (+)   Anesthesia Other Findings Past Medical History: No date: Acid reflux No date: Anxiety 11/07/2015: Menstrual migraine   Reproductive/Obstetrics                            Anesthesia Physical Anesthesia Plan  ASA: II  Anesthesia Plan: General   Post-op Pain Management:    Induction: Intravenous  Airway Management Planned: Oral ETT  Additional Equipment:   Intra-op Plan:   Post-operative Plan: Extubation in OR  Informed Consent: I have reviewed the patients History and Physical, chart, labs and discussed the procedure including the risks, benefits and alternatives for the proposed anesthesia with the patient or authorized representative who has indicated his/her understanding and acceptance.   Dental advisory given  Plan Discussed with: CRNA and Anesthesiologist  Anesthesia Plan Comments:         Anesthesia Quick Evaluation

## 2016-06-18 NOTE — Op Note (Signed)
OPERATIVE NOTE:  Vicki Reese PROCEDURE DATE: 06/18/2016  PREOPERATIVE DIAGNOSIS:  1. Symptomatic endometriosis 2. Uterine fibroids  POSTOPERATIVE DIAGNOSIS:  1 symptomatic endometriosis 2. Uterine fibroids  PROCEDURE:  LAVH bilateral salpingectomy  SURGEON:  Brayton Mars, MD ASSISTANTS: Dr. Marcelline Mates ANESTHESIA: General INDICATIONS: 45 y.o. Vicki Reese with known endometriosis by laparoscopic biopsies, and uterine fibroids, symptomatic, presents for definitive surgical management.  FINDINGS:   Normal appearing fallopian tubes and ovaries. Multi-fibroid uterus; thickening of uterosacral ligaments with endometriosis implants, right greater than left   I/O's: Total I/O In: 1000 [I.V.:1000] Out: 300 [Blood:300] COUNTS:  YES SPECIMENS:  Uterus and bilateral fallopian tubes ANTIBIOTIC PROPHYLAXIS:Ancef 2 grams COMPLICATIONS: None immediate  PROCEDURE IN DETAIL: Patient was brought to the operating room placed in supine position. General endotracheal anesthesia was induced without difficulty. She was placed in the dorsal lithotomy position using the bumblebee stirrups. A ChloraPrep abdominal perineal intravaginal prep and drape was performed. Timeout was completed. Foley catheter was placed in the bladder and was draining clear yellow urine. A Hulka TENACULUM was placed on the cervix to facilitate uterine manipulation. Subumbilical vertical incision was made 5 mm in length. The Optiview laparoscopic trocar system was used to place a port directly into the abdominal pelvic cavity without evidence of bowel or vascular injury. 2 other 5 mm ports were placed in the right and left lower quadrants respectively under direct visualization. Fallopian tubes are excised using the Ace Harmonic scalpel and graspers. The mesosalpinx is grasped cauterized and cut. This was carried out from the fimbriated ends to the uterine cornu. The specimen is removed from the operative field. Similar  procedure was carried out on the contralateral tube. Hysterectomy was then performed in standard fashion. The Ace Harmonic scalpel was used to grasp, ligate, and cut the round ligaments. The cardinal broad ligament complexes are then taken down using the Ace Harmonic scalpel. This is carried out down level of the uterosacral ligaments. The bladder flap was created by opening the anterior aspect of the peritoneal reflection. Bladder was dissected off the lower uterine segment both sharply and bluntly. The uterine arteries are coagulated with Kleppinger bipolar forceps bilaterally. The vaginal aspect of the procedure was then performed. The Hulka TENACULUM WAS REMOVED AND REPLACED WITH A DOUBLE-TOOTH TENACULUM. POSTERIOR COLPOTOMY IS MADE WITH MAYO SCISSORS. UTEROSACRAL LIGAMENTS ARE CLAMPED, CUT,  AND STICK TIED USING 0 VICRYL SUTURES. THESE WERE TAGGED. THE CARDINAL BROAD LIGAMENT COMPLEXES WERE TAKEN DOWN FROM BELOW USING A CLAMPING, CUTTING, AND STICK TYING TECHNIQUE. THIS IS CARRIED OUT UNTIL ALL PEDICLES ARE FREE AND THE SPECIMEN WAS REMOVED FROM THE VAGINA. 2 ARTERIAL BLEEDERS WERE IDENTIFIED, ONE ON EACH PELVIC SIDE wall. These are isolated with right angle clamps and suture ligatures are placed with hemostasis being obtained. The posterior cuff of the vagina is then sutured using 0 chromic suture in a running baseball stitch manner. This was followed by closure of the vagina using simple interrupted chromic sutures. Good hemostasis is obtained. Repeat laparoscopy was performed with copious irrigation of the pelvis. One small arteriolar bleeder is identified and cauterized with Kleppinger bipolar forceps. Hemostasis was otherwise intact. Following copious irrigation of the pelvis and aspiration of the irrigant fluid, the procedure was terminated with all instrumentation being removed from the abdominal cavity. Pneumoperitoneum was released. Incisions were closed with 4-0 Vicryl suture in a simple manner.  Dermabond glue was placed over the patient was then awakened mobilized and taken to recovery in satisfactory condition.  Vicki A. Zipporah Plants, MD,  [facility]

## 2016-06-18 NOTE — Interval H&P Note (Signed)
History and Physical Interval Note:  06/18/2016 11:14 AM  Vicki Reese  has presented today for surgery, with the diagnosis of endometriosis determined by laparoscopy, chronic pelvic pain, dyspareunia,abnormal uterine bleeding  The various methods of treatment have been discussed with the patient and family. After consideration of risks, benefits and other options for treatment, the patient has consented to  Procedure(s): LAPAROSCOPIC ASSISTED VAGINAL HYSTERECTOMY BILATERAL SALPINGECTOMY (N/A) as a surgical intervention .  The patient's history has been reviewed, patient examined, no change in status, stable for surgery.  I have reviewed the patient's chart and labs.  Questions were answered to the patient's satisfaction.     Hassell Done A Defrancesco

## 2016-06-19 DIAGNOSIS — N809 Endometriosis, unspecified: Secondary | ICD-10-CM | POA: Diagnosis not present

## 2016-06-19 LAB — HEMOGLOBIN: HEMOGLOBIN: 12.3 g/dL (ref 12.0–16.0)

## 2016-06-19 MED ORDER — SODIUM CHLORIDE 0.9 % IJ SOLN
INTRAMUSCULAR | Status: AC
  Filled 2016-06-19: qty 10

## 2016-06-19 MED ORDER — DOCUSATE SODIUM 100 MG PO CAPS: 100.0000 mg | ORAL_CAPSULE | Freq: Two times a day (BID) | ORAL | 0 refills | Status: DC

## 2016-06-19 MED ORDER — OXYCODONE-ACETAMINOPHEN 5-325 MG PO TABS: 1.0000 | ORAL_TABLET | ORAL | 0 refills | Status: DC | PRN

## 2016-06-19 NOTE — Discharge Summary (Signed)
Physician Discharge Summary  Patient ID: Vicki Reese MRN: [id number] DOB/AGE: 09-10-1970 45 y.o.  Admit date: 06/18/2016 Discharge date: 06/19/2016  Admission Diagnoses: Endometriosis and Fibroids  Discharge Diagnoses:  Endometriosis and Fibroids  Operative Procedures: Procedure(s): LAPAROSCOPIC ASSISTED VAGINAL HYSTERECTOMY WITH BILATERAL SALPINGECTOMY (N/A)  Hospital Course: Uncomplicated.   Significant Diagnostic Studies:  Lab Results  Component Value Date   HGB 12.3 06/19/2016   HGB 14.0 06/15/2016   HGB 13.4 05/13/2016   Lab Results  Component Value Date   HCT 40.3 06/15/2016   HCT 39.6 05/13/2016   HCT 38.9 03/21/2016   CBC Latest Ref Rng & Units 06/19/2016 06/15/2016 05/13/2016  WBC 3.6 - 11.0 K/uL - 13.0(H) 10.4  Hemoglobin 12.0 - 16.0 g/dL 12.3 14.0 13.4  Hematocrit 35.0 - 47.0 % - 40.3 39.6  Platelets 150 - 440 K/uL - 272 270     Discharged Condition: good  Discharge Exam: Blood pressure (!) 95/49, pulse 91, temperature 98.4 F (36.9 C), temperature source Oral, resp. rate 18, height 5' 3"  (1.6 m), weight 165 lb (74.8 kg), last menstrual period 06/06/2016, SpO2 100 %. Incision/Wound: clean, dry, no drainage and minimal vaginal spotting  Disposition: 01-Home or Self Care  Discharge Instructions    Discharge patient    Complete by:  As directed        Medication List    STOP taking these medications   JUNEL FE 1/20 1-20 MG-MCG tablet Generic drug:  norethindrone-ethinyl estradiol     TAKE these medications   BIOFREEZE EX Apply 1 application topically daily after breakfast. Back pain   buPROPion 300 MG 24 hr tablet Commonly known as:  WELLBUTRIN XL Take 1 tablet (300 mg total) by mouth daily.   calcium carbonate 500 MG chewable tablet Commonly known as:  TUMS - dosed in mg elemental calcium Chew 2 tablets by mouth 2 (two) times daily as needed for indigestion or heartburn.   docusate sodium 100 MG capsule Commonly known as:   COLACE Take 1 capsule (100 mg total) by mouth 2 (two) times daily.   ibuprofen 200 MG tablet Commonly known as:  ADVIL,MOTRIN Take 800 mg by mouth every 6 (six) hours as needed for mild pain.   oxyCODONE-acetaminophen 5-325 MG tablet Commonly known as:  PERCOCET/ROXICET Take 1-2 tablets by mouth every 4 (four) hours as needed (moderate to severe pain (when tolerating fluids)).   pantoprazole 40 MG tablet Commonly known as:  PROTONIX Take 1 tablet (40 mg total) by mouth daily. What changed:  when to take this      Follow-up Information    Brayton Mars, MD. Go in 1 week.   Specialties:  Obstetrics and Gynecology, Radiology Why:  Post Op Check Contact information: [address] [place] [id number] [phone]           Signed: Alanda Slim Tc [place] 06/19/2016, 1:01 PM

## 2016-06-19 NOTE — Progress Notes (Signed)
D/C order from MD.  Reviewed d/c instructions and prescriptions with patient and answered any questions.  Patient d/c home via wheelchair by nursing/auxillary. MD left prescription for patient but did not sign so patient's husband is going by the office for him to sign.

## 2016-06-21 LAB — SURGICAL PATHOLOGY

## 2016-07-02 ENCOUNTER — Other Ambulatory Visit: Payer: Self-pay | Admitting: Physician Assistant

## 2016-07-02 ENCOUNTER — Other Ambulatory Visit: Payer: Self-pay

## 2016-07-02 DIAGNOSIS — K219 Gastro-esophageal reflux disease without esophagitis: Secondary | ICD-10-CM

## 2016-07-02 MED ORDER — BUPROPION HCL ER (XL) 300 MG PO TB24
300.0000 mg | ORAL_TABLET | Freq: Every day | ORAL | 3 refills | Status: DC
Start: 2016-07-02 — End: 2017-07-08

## 2016-07-02 NOTE — Telephone Encounter (Signed)
Requesting 90 days supply

## 2016-07-04 ENCOUNTER — Ambulatory Visit (INDEPENDENT_AMBULATORY_CARE_PROVIDER_SITE_OTHER): Payer: BLUE CROSS/BLUE SHIELD | Admitting: Obstetrics and Gynecology

## 2016-07-04 ENCOUNTER — Encounter: Payer: Self-pay | Admitting: Obstetrics and Gynecology

## 2016-07-04 VITALS — BP 116/71 | HR 87 | Temp 98.3°F | Ht 63.0 in | Wt 165.7 lb

## 2016-07-04 DIAGNOSIS — Z90710 Acquired absence of both cervix and uterus: Secondary | ICD-10-CM

## 2016-07-04 DIAGNOSIS — R102 Pelvic and perineal pain: Secondary | ICD-10-CM

## 2016-07-04 DIAGNOSIS — G8929 Other chronic pain: Secondary | ICD-10-CM

## 2016-07-04 DIAGNOSIS — R309 Painful micturition, unspecified: Secondary | ICD-10-CM | POA: Diagnosis not present

## 2016-07-04 DIAGNOSIS — N941 Unspecified dyspareunia: Secondary | ICD-10-CM

## 2016-07-04 DIAGNOSIS — Z09 Encounter for follow-up examination after completed treatment for conditions other than malignant neoplasm: Secondary | ICD-10-CM

## 2016-07-04 DIAGNOSIS — N809 Endometriosis, unspecified: Secondary | ICD-10-CM

## 2016-07-04 LAB — POCT URINALYSIS DIPSTICK
BILIRUBIN UA: 1
GLUCOSE UA: NEGATIVE
Ketones, UA: NEGATIVE
NITRITE UA: NEGATIVE
Protein, UA: NEGATIVE
Spec Grav, UA: 1.02
UROBILINOGEN UA: NEGATIVE
pH, UA: 6

## 2016-07-04 NOTE — Progress Notes (Signed)
Chief complaint: 1. Two-week postop check 2. Status post LAVH bilateral salpingectomy  Patient is doing well at this time. She is taking ibuprofen only for pain. She took narcotic analgesic therapy for 72 hours post-hysterectomy. Bowel and bladder function are normal. She returned to work this week and has noted some increased fatigue symptoms  PATHOLOGY: DIAGNOSIS:  A. UTERUS WITH CERVIX AND BILATERAL FALLOPIAN TUBES; HYSTERECTOMY WITH  BILATERAL SALPINGECTOMY:  - ACUTE AND CHRONIC CERVICITIS.  - INACTIVE ENDOMETRIUM.  - LEIOMYOMATA, UP TO 1.1 CM; WITHOUT ATYPIA, NECROSIS OR INCREASED  MITOSES.  - BILATERAL FALLOPIAN TUBES WITHOUT PATHOLOGIC CHANGE.   OBJECTIVE: BP 116/71   Pulse 87   Temp 98.3 F (36.8 C)   Ht 5' 3"  (1.6 m)   Wt 165 lb 11.2 oz (75.2 kg)   LMP 06/06/2016 (Approximate)   BMI 29.35 kg/m  Abdomen: Laparoscopy incisions well approximated; no induration or hernia  ASSESSMENT: 1. Normal postop check 2 weeks status post LAVH bilateral salpingectomy  PLAN: 1. Resume activities as tolerated 2. Return in 4 weeks for final postop check   Brayton Mars, MD  Note: This dictation with the was prepared with Dragon dictation along with smaller phrase technology. Any transcriptional errors that result from this process are unintentional.  eventually the posterior third of the adnexa

## 2016-07-04 NOTE — Patient Instructions (Signed)
1. Resume activities as tolerated 2. Return in 4 weeks for final postop check

## 2016-07-06 LAB — URINE CULTURE

## 2016-08-08 ENCOUNTER — Ambulatory Visit (INDEPENDENT_AMBULATORY_CARE_PROVIDER_SITE_OTHER): Payer: BLUE CROSS/BLUE SHIELD | Admitting: Obstetrics and Gynecology

## 2016-08-08 VITALS — BP 124/81 | HR 91 | Ht 63.0 in | Wt 170.9 lb

## 2016-08-08 DIAGNOSIS — Z09 Encounter for follow-up examination after completed treatment for conditions other than malignant neoplasm: Secondary | ICD-10-CM

## 2016-08-08 DIAGNOSIS — N809 Endometriosis, unspecified: Secondary | ICD-10-CM

## 2016-08-08 DIAGNOSIS — D259 Leiomyoma of uterus, unspecified: Secondary | ICD-10-CM | POA: Insufficient documentation

## 2016-08-08 DIAGNOSIS — Z90710 Acquired absence of both cervix and uterus: Secondary | ICD-10-CM

## 2016-08-08 NOTE — Progress Notes (Signed)
Chief complaint: 1. Final postop check 2. Status post LAVH bilateral salpingectomy  PATHOLOGY: DIAGNOSIS:  A. UTERUS WITH CERVIX AND BILATERAL FALLOPIAN TUBES; HYSTERECTOMY WITH  BILATERAL SALPINGECTOMY:  - ACUTE AND CHRONIC CERVICITIS.  - INACTIVE ENDOMETRIUM.  - LEIOMYOMATA, UP TO 1.1 CM; WITHOUT ATYPIA, NECROSIS OR INCREASED  MITOSES.  - BILATERAL FALLOPIAN TUBES WITHOUT PATHOLOGIC CHANGE.    SUBJECTIVE:  OBJECTIVE: BP 124/81   Pulse 91   Ht 5' 3"  (1.6 m)   Wt 170 lb 14.4 oz (77.5 kg)   LMP 06/06/2016 (Approximate)   BMI 30.27 kg/m    ASSESSMENT: 1. Status post LAVH bilateral salpingectomy for symptomatic endometriosis 2. Uterine fibroids   PLAN: 1. Resume activities as tolerated 2. Return in 1 year for annual

## 2016-08-08 NOTE — Patient Instructions (Signed)
1. Resume activities as tolerated 2. Return in 1 year for annual exam

## 2016-10-30 ENCOUNTER — Encounter: Payer: Self-pay | Admitting: Obstetrics and Gynecology

## 2016-11-07 ENCOUNTER — Ambulatory Visit (INDEPENDENT_AMBULATORY_CARE_PROVIDER_SITE_OTHER): Payer: BLUE CROSS/BLUE SHIELD | Admitting: Obstetrics and Gynecology

## 2016-11-07 ENCOUNTER — Encounter: Payer: Self-pay | Admitting: Obstetrics and Gynecology

## 2016-11-07 VITALS — BP 128/72 | HR 89 | Ht 63.0 in | Wt 161.2 lb

## 2016-11-07 DIAGNOSIS — Z803 Family history of malignant neoplasm of breast: Secondary | ICD-10-CM

## 2016-11-07 DIAGNOSIS — Z1231 Encounter for screening mammogram for malignant neoplasm of breast: Secondary | ICD-10-CM

## 2016-11-07 DIAGNOSIS — N644 Mastodynia: Secondary | ICD-10-CM | POA: Diagnosis not present

## 2016-11-07 DIAGNOSIS — Z1239 Encounter for other screening for malignant neoplasm of breast: Secondary | ICD-10-CM

## 2016-11-07 NOTE — Patient Instructions (Signed)
1. Screening mammogram is ordered 2. Monitor breast symptoms to see if they appear on a monthly cyclic basis 3. May use nonsteroidal anti-inflammatory medications as needed for help with breast tenderness Breast Tenderness Breast tenderness is a common problem for women of all ages. Breast tenderness may cause mild discomfort to severe pain. The pain usually comes and goes in association with your menstrual cycle, but it can be constant. Breast tenderness has many possible causes, including hormone changes and some medicines. Your health care provider may order tests, such as a mammogram or an ultrasound, to check for any unusual findings. Having breast tenderness usually does not mean that you have breast cancer. Follow these instructions at home: Sometimes, reassurance that you do not have breast cancer is all that is needed. In general, follow these home care instructions: Managing pain and discomfort   If directed, apply ice to the area:  Put ice in a plastic bag.  Place a towel between your skin and the bag.  Leave the ice on for 20 minutes, 2-3 times a day.  Make sure you are wearing a supportive bra, especially during exercise. You may also want to wear a supportive bra while sleeping if your breasts are very tender. Medicines   Take over-the-counter and prescription medicines only as told by your health care provider. If the cause of your pain is infection, you may be prescribed an antibiotic medicine.  If you were prescribed an antibiotic, take it as told by your health care provider. Do not stop taking the antibiotic even if you start to feel better. General instructions   Your health care provider may recommend that you reduce the amount of fat in your diet. You can do this by:  Limiting fried foods.  Cooking foods using methods, such as baking, boiling, grilling, and broiling.  Decrease the amount of caffeine in your diet. You can do this by drinking more water and choosing  caffeine-free options.  Keep a log of the days and times when your breasts are most tender.  Ask your health care provider how to do breast exams at home. This will help you notice if you have an unusual growth or lump. Contact a health care provider if:  Any part of your breast is hard, red, and hot to the touch. This may be a sign of infection.  You are not breastfeeding and you have fluid, especially blood or pus, coming out of your nipples.  You have a fever.  You have a new or painful lump in your breast that remains after your menstrual period ends.  Your pain does not improve or it gets worse.  Your pain is interfering with your daily activities. This information is not intended to replace advice given to you by your health care provider. Make sure you discuss any questions you have with your health care provider. Document Released: 07/05/2008 Document Revised: 04/20/2016 Document Reviewed: 04/20/2016 Elsevier Interactive Patient Education  2017 [employer].

## 2016-11-07 NOTE — Progress Notes (Signed)
GYN ENCOUNTER NOTE  Subjective:       Vicki Reese is a 46 y.o. [id number] female is here for gynecologic evaluation of the following issues:  1. Breast pain  Patient is status Reese LAVH bilateral salpingectomy. Over the past several months she has noticed cyclic breast tenderness which seems to become more focused around the left nipple. The tenderness last for about 4-5 days and then resolves spontaneously. She has been using some ibuprofen as well as wearing of good support bra for management of the discomfort. Patient does have family history of breast cancer and mom was diagnosed postmenopausally.   Gynecologic History Patient's last menstrual period was 06/06/2016 (approximate). Contraception: status Reese hysterectomy Last Pap: Normal Last mammogram: No recent mammogram  Obstetric History OB History  Gravida Para Term Preterm AB Living  3 2 2   1 2   SAB TAB Ectopic Multiple Live Births  1       2    # Outcome Date GA Lbr Len/2nd Weight Sex Delivery Anes PTL Lv  3 Term 2007   10 lb (4.536 kg) M Vag-Spont   LIV  2 SAB 2006          1 Term 1998   8 lb 1.9 oz (3.683 kg) M Vag-Spont   LIV      Past Medical History:  Diagnosis Date  . Acid reflux   . Anxiety   . Menstrual migraine 11/07/2015    Past Surgical History:  Procedure Laterality Date  . DIAGNOSTIC LAPAROSCOPY    . DILATION AND CURETTAGE OF UTERUS    . LAPAROSCOPIC ASSISTED VAGINAL HYSTERECTOMY N/A 06/18/2016   Procedure: LAPAROSCOPIC ASSISTED VAGINAL HYSTERECTOMY WITH BILATERAL SALPINGECTOMY;  Surgeon: Brayton Mars, MD;  Location: [facility] ORS;  Service: Gynecology;  Laterality: N/A;  . laparoscopy      Current Outpatient Prescriptions on File Prior to Visit  Medication Sig Dispense Refill  . buPROPion (WELLBUTRIN XL) 300 MG 24 hr tablet Take 1 tablet (300 mg total) by mouth daily. 90 tablet 3  . calcium carbonate (TUMS - DOSED IN MG ELEMENTAL CALCIUM) 500 MG chewable tablet Chew 2 tablets by mouth 2  (two) times daily as needed for indigestion or heartburn.    . docusate sodium (COLACE) 100 MG capsule Take 1 capsule (100 mg total) by mouth 2 (two) times daily. 10 capsule 0  . ibuprofen (ADVIL,MOTRIN) 200 MG tablet Take 800 mg by mouth every 6 (six) hours as needed for mild pain.    . pantoprazole (PROTONIX) 40 MG tablet TAKE 1 TABLET BY MOUTH EVERY DAY 30 tablet 5   No current facility-administered medications on file prior to visit.     No Known Allergies  Social History   Social History  . Marital status: Married    Spouse name: N/A  . Number of children: N/A  . Years of education: N/A   Occupational History  . Not on file.   Social History Main Topics  . Smoking status: Former Smoker    Quit date: 08/06/2006  . Smokeless tobacco: Never Used     Comment: QUIT IN 2008  . Alcohol use 0.0 oz/week     Comment: OCCASIONALLY  . Drug use: No  . Sexual activity: Yes    Birth control/ protection: Surgical   Other Topics Concern  . Not on file   Social History Narrative  . No narrative on file    Family History  Problem Relation Age of Onset  . Breast cancer  Mother   . Lupus Father   . Diabetes Father   . Heart disease Paternal Grandmother   . Ovarian cancer Neg Hx   . Colon cancer Neg Hx     The following portions of the patient's history were reviewed and updated as appropriate: allergies, current medications, past family history, past medical history, past social history, past surgical history and problem list.  Review of Systems Per history of present illness  Objective:   BP 128/72   Pulse 89   Ht 5' 3"  (1.6 m)   Wt 161 lb 3.2 oz (73.1 kg)   LMP 06/06/2016 (Approximate)   BMI 28.56 kg/m  CONSTITUTIONAL: Well-developed, well-nourished female in no acute distress.  HENT:  Normocephalic, atraumatic.  NECK: Normal range of motion, supple, no masses.  Normal thyroid.  SKIN: Skin is warm and dry. No rash noted. Not diaphoretic. No erythema. No  pallor. [place]: Alert and oriented to person, place, and time. PSYCHIATRIC: Normal mood and affect. Normal behavior. Normal judgment and thought content. CARDIOVASCULAR:Not Examined RESPIRATORY: Not Examined BREASTS: Bilaterally symmetric without dominant masses adenopathy or nipple discharge. Mild fibrocystic changes are noted bilaterally ABDOMEN: Not examined PELVIC: Not examined MUSCULOSKELETAL: Not examined     Assessment:   1. Mastodynia, cyclic, without obvious abnormality on exam today  2. Screening for breast cancer - MM DIGITAL SCREENING BILATERAL; Future  3. Family history of breast cancer     Plan:   1. Screening mammogram 2. Monthly self breast exam 3. Return for annual exam or as needed 4. Reassurance is given today as to her normal exam  A total of 15 minutes were spent face-to-face with the patient during this encounter and over half of that time dealt with counseling and coordination of care.  Brayton Mars, MD  Note: This dictation was prepared with Dragon dictation along with smaller phrase technology. Any transcriptional errors that result from this process are unintentional.

## 2016-12-14 ENCOUNTER — Encounter: Payer: Self-pay | Admitting: Physician Assistant

## 2016-12-14 DIAGNOSIS — F5101 Primary insomnia: Secondary | ICD-10-CM

## 2016-12-14 MED ORDER — ZOLPIDEM TARTRATE ER 6.25 MG PO TBCR: 6.2500 mg | EXTENDED_RELEASE_TABLET | Freq: Every evening | ORAL | 0 refills | Status: DC | PRN

## 2016-12-21 ENCOUNTER — Ambulatory Visit
Admission: RE | Admit: 2016-12-21 | Discharge: 2016-12-21 | Disposition: A | Discharge status: Home / Self Care | Payer: BLUE CROSS/BLUE SHIELD | Source: Ambulatory Visit | Attending: Obstetrics and Gynecology | Admitting: Obstetrics and Gynecology

## 2016-12-21 DIAGNOSIS — Z1231 Encounter for screening mammogram for malignant neoplasm of breast: Secondary | ICD-10-CM | POA: Insufficient documentation

## 2016-12-21 DIAGNOSIS — Z1239 Encounter for other screening for malignant neoplasm of breast: Secondary | ICD-10-CM

## 2016-12-30 ENCOUNTER — Other Ambulatory Visit: Payer: Self-pay | Admitting: Physician Assistant

## 2016-12-30 DIAGNOSIS — K219 Gastro-esophageal reflux disease without esophagitis: Secondary | ICD-10-CM

## 2017-01-01 NOTE — Telephone Encounter (Signed)
Pt was prescribed Protonix for GERD on 05/13/2016 by [facility].

## 2017-01-17 ENCOUNTER — Encounter: Payer: Self-pay | Admitting: Physician Assistant

## 2017-01-18 ENCOUNTER — Other Ambulatory Visit: Payer: Self-pay | Admitting: Physician Assistant

## 2017-01-18 DIAGNOSIS — F5101 Primary insomnia: Secondary | ICD-10-CM

## 2017-01-18 MED ORDER — ZOLPIDEM TARTRATE ER 6.25 MG PO TBCR: 6.2500 mg | EXTENDED_RELEASE_TABLET | Freq: Every evening | ORAL | 5 refills | Status: DC | PRN

## 2017-01-18 NOTE — Telephone Encounter (Signed)
Called into [facility]

## 2017-03-20 ENCOUNTER — Encounter: Payer: Self-pay | Admitting: Physician Assistant

## 2017-04-30 ENCOUNTER — Other Ambulatory Visit: Payer: Self-pay | Admitting: Physician Assistant

## 2017-04-30 DIAGNOSIS — K219 Gastro-esophageal reflux disease without esophagitis: Secondary | ICD-10-CM

## 2017-06-15 ENCOUNTER — Other Ambulatory Visit: Payer: Self-pay

## 2017-06-15 ENCOUNTER — Encounter: Payer: Self-pay | Admitting: Emergency Medicine

## 2017-06-15 ENCOUNTER — Emergency Department
Admission: EM | Admit: 2017-06-15 | Discharge: 2017-06-15 | Disposition: A | Discharge status: Home / Self Care | Payer: BLUE CROSS/BLUE SHIELD | Attending: Emergency Medicine | Admitting: Emergency Medicine

## 2017-06-15 DIAGNOSIS — Z87891 Personal history of nicotine dependence: Secondary | ICD-10-CM | POA: Insufficient documentation

## 2017-06-15 DIAGNOSIS — Z79899 Other long term (current) drug therapy: Secondary | ICD-10-CM | POA: Insufficient documentation

## 2017-06-15 DIAGNOSIS — T7421XA Adult sexual abuse, confirmed, initial encounter: Secondary | ICD-10-CM | POA: Insufficient documentation

## 2017-06-15 DIAGNOSIS — F419 Anxiety disorder, unspecified: Secondary | ICD-10-CM | POA: Insufficient documentation
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
## 2017-06-15 NOTE — ED Notes (Signed)
Security called at [phone] for escort for patient to her car from lobby.

## 2017-06-15 NOTE — ED Notes (Signed)
[facility] Nurse at bedside

## 2017-06-15 NOTE — ED Notes (Signed)
Spoke with Ria Comment with S[facility], the oncoming nurse at 7p will be the one coming to evaluate the patient. Patient offered [facility] counseling at this time. Pt states she will think about it and let me know.  Explained delay to the patient that it will not be until after 7pm until she is evaluated.
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
## 2017-06-15 NOTE — ED Notes (Signed)
[employer] still at bedside at dc time, they said they needed a few more minutes.

## 2017-06-15 NOTE — ED Notes (Signed)
[employer] at bedside
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
## 2017-06-16 NOTE — SANE Note (Signed)
Follow-up Phone Call  Patient gives verbal consent for a FNE/SANE follow-up phone call in 48-72 hours: No   Patient's telephone number: [phone] Patient gives verbal consent to leave voicemail at the phone number listed above: No DO NOT CALL between the hours of: N/A- Patient does not wish to be called at this time.
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
## 2017-07-08 ENCOUNTER — Other Ambulatory Visit: Payer: Self-pay | Admitting: Physician Assistant

## 2017-07-22 ENCOUNTER — Telehealth: Payer: Self-pay | Admitting: Physician Assistant

## 2017-07-22 DIAGNOSIS — K219 Gastro-esophageal reflux disease without esophagitis: Secondary | ICD-10-CM

## 2017-07-22 NOTE — Telephone Encounter (Signed)
[facility] [address] faxed refill request for the following medications:  pantoprazole (PROTONIX) 40 MG tablet  90 day supply  Please advise. Thanks TNP

## 2017-07-23 MED ORDER — PANTOPRAZOLE SODIUM 40 MG PO TBEC: 40.0000 mg | DELAYED_RELEASE_TABLET | Freq: Every day | ORAL | 1 refills | Status: DC

## 2017-07-23 NOTE — Telephone Encounter (Signed)
refilled 

## 2018-01-02 ENCOUNTER — Encounter: Payer: Self-pay | Admitting: Obstetrics and Gynecology

## 2018-01-02 ENCOUNTER — Telehealth: Payer: Self-pay | Admitting: Obstetrics and Gynecology

## 2018-01-02 NOTE — Telephone Encounter (Signed)
The patient sent me a MyChart message stating "I am having breast pain again around the nipple. I am due for my annual mammogram. Would it be best to schedule that first or come and see Dr. D first. Thank you in advance." I am routing the message to nurse to make sure the patient is given the correct advice. Please advise.

## 2018-01-02 NOTE — Telephone Encounter (Signed)
Son answered pts phone. He states she is in the store. Will try later.

## 2018-01-06 NOTE — Telephone Encounter (Signed)
Pt aware to bring new insurance card and co pay if applicable.

## 2018-01-06 NOTE — Telephone Encounter (Signed)
Pt due for ae. Advised we will do ae and order mammo after. [place] to make appt for 6/6 at 9:30.

## 2018-01-09 ENCOUNTER — Encounter: Payer: Self-pay | Admitting: Obstetrics and Gynecology

## 2018-01-09 ENCOUNTER — Ambulatory Visit: Payer: BLUE CROSS/BLUE SHIELD | Admitting: Obstetrics and Gynecology

## 2018-01-09 VITALS — BP 112/70 | HR 79 | Ht 63.0 in | Wt 152.7 lb

## 2018-01-09 DIAGNOSIS — N63 Unspecified lump in unspecified breast: Secondary | ICD-10-CM | POA: Diagnosis not present

## 2018-01-09 DIAGNOSIS — Z803 Family history of malignant neoplasm of breast: Secondary | ICD-10-CM | POA: Insufficient documentation

## 2018-01-09 DIAGNOSIS — N644 Mastodynia: Secondary | ICD-10-CM

## 2018-01-09 NOTE — Progress Notes (Signed)
GYN ENCOUNTER NOTE  Subjective:       Vicki Reese is a 47 y.o. [id number] female is here for gynecologic evaluation of the following issues:  1.  Bilateral nipple tenderness.    47 year old para 2-0-1-2 female, status Reese hysterectomy, presents for 3-week history of bilateral nipple tenderness which has progressively worsened.  She states that symptoms are significant when not wearing a bra and she needs to do this continuously including bedtime to help with nipple soreness.  She denies any trauma.  She denies any nipple discharge.  She denies chronic medical stimulation. Caffeine intake is minimal.  1 to 2 cups of coffee maximum is noted.  She does not eat chocolate chronically. There is family history of breast cancer in mom diagnosed in her 2s. Last mammogram May 2018 was BI-RADS 1   Gynecologic History Patient's last menstrual period was 06/06/2016 (approximate). Contraception: status Reese hysterectomy  Obstetric History OB History  Gravida Para Term Preterm AB Living  3 2 2   1 2   SAB TAB Ectopic Multiple Live Births  1       2    # Outcome Date GA Lbr Len/2nd Weight Sex Delivery Anes PTL Lv  3 Term 2007   10 lb (4.536 kg) M Vag-Spont   LIV  2 SAB 2006          1 Term 1998   8 lb 1.9 oz (3.683 kg) M Vag-Spont   LIV    Past Medical History:  Diagnosis Date  . Acid reflux   . Anxiety   . Menstrual migraine 11/07/2015    Past Surgical History:  Procedure Laterality Date  . DIAGNOSTIC LAPAROSCOPY    . DILATION AND CURETTAGE OF UTERUS    . LAPAROSCOPIC ASSISTED VAGINAL HYSTERECTOMY N/A 06/18/2016   Procedure: LAPAROSCOPIC ASSISTED VAGINAL HYSTERECTOMY WITH BILATERAL SALPINGECTOMY;  Surgeon: Brayton Mars, MD;  Location: [facility] ORS;  Service: Gynecology;  Laterality: N/A;  . laparoscopy      Current Outpatient Medications on File Prior to Visit  Medication Sig Dispense Refill  . buPROPion (WELLBUTRIN XL) 300 MG 24 hr tablet TAKE 1 TABLET (300 MG TOTAL) BY MOUTH  DAILY. 90 tablet 1  . ibuprofen (ADVIL,MOTRIN) 200 MG tablet Take 800 mg by mouth every 6 (six) hours as needed for mild pain.    . pantoprazole (PROTONIX) 40 MG tablet Take 1 tablet (40 mg total) by mouth daily. 90 tablet 1   No current facility-administered medications on file prior to visit.     No Known Allergies  Social History   Socioeconomic History  . Marital status: Legally Separated    Spouse name: Not on file  . Number of children: Not on file  . Years of education: Not on file  . Highest education level: Not on file  Occupational History  . Not on file  Social Needs  . Financial resource strain: Not on file  . Food insecurity:    Worry: Not on file    Inability: Not on file  . Transportation needs:    Medical: Not on file    Non-medical: Not on file  Tobacco Use  . Smoking status: Former Smoker    Last attempt to quit: 08/06/2006    Years since quitting: 11.4  . Smokeless tobacco: Never Used  . Tobacco comment: QUIT IN 2008  Substance and Sexual Activity  . Alcohol use: Yes    Alcohol/week: 0.0 oz    Comment: OCCASIONALLY  . Drug use: No  .  Sexual activity: Yes    Birth control/protection: Surgical  Lifestyle  . Physical activity:    Days per week: Not on file    Minutes per session: Not on file  . Stress: Not on file  Relationships  . Social connections:    Talks on phone: Not on file    Gets together: Not on file    Attends religious service: Not on file    Active member of club or organization: Not on file    Attends meetings of clubs or organizations: Not on file    Relationship status: Not on file  . Intimate partner violence:    Fear of current or ex partner: Not on file    Emotionally abused: Not on file    Physically abused: Not on file    Forced sexual activity: Not on file  Other Topics Concern  . Not on file  Social History Narrative  . Not on file    Family History  Problem Relation Age of Onset  . Breast cancer Mother   . Lupus  Father   . Diabetes Father   . Heart disease Paternal Grandmother   . Ovarian cancer Neg Hx   . Colon cancer Neg Hx     The following portions of the patient's history were reviewed and updated as appropriate: allergies, current medications, past family history, past medical history, past social history, past surgical history and problem list.  Review of Systems Review of Systems -comprehensive review of systems is negative except for that noted in HPI  Objective:   BP 112/70   Pulse 79   Ht 5' 3"  (1.6 m)   Wt 152 lb 11.2 oz (69.3 kg)   LMP 06/06/2016 (Approximate)   BMI 27.05 kg/m  CONSTITUTIONAL: Well-developed, well-nourished female in no acute distress.  HENT:  Normocephalic, atraumatic.  NECK: Normal range of motion, supple, no masses.  Normal thyroid.  SKIN: Skin is warm and dry. No rash noted. Not diaphoretic. No erythema. No pallor. [place]: Alert and oriented to person, place, and time. PSYCHIATRIC: Normal mood and affect. Normal behavior. Normal judgment and thought content. CARDIOVASCULAR:Not Examined RESPIRATORY: Not Examined BREASTS: Bilaterally symmetric; no skin changes or nipple discharge; tenderness is noted along both areola with minimal fibrocystic changes noted beneath the areola, in particular there is a 1 x 1 cm irregular cystic mass noted at the 3 o'clock position of the left nipple that is tender. ABDOMEN: Soft, non distended; Non tender.  No Organomegaly. PELVIC: Deferred MUSCULOSKELETAL:   No cyanosis, clubbing, or edema of extremities     Assessment:   1. Bilateral mastodynia, nipple tenderness - MM DIAG BREAST TOMO BILATERAL; Future - US BREAST LTD UNI LEFT INC AXILLA; Future  2. Breast nodule, 3:00 left nipple - MM DIAG BREAST TOMO BILATERAL; Future - US BREAST LTD UNI LEFT INC AXILLA; Future   3.  Family history of breast cancer, mother  Plan:   1.  Maintain symptom diary 2.  Diagnostic mammogram with ultrasound 3.  Minimize caffeine  intake and chocolate intake 4.  Return for reassessment and annual exam in 4 weeks  A total of 15 minutes were spent face-to-face with the patient during this encounter and over half of that time dealt with counseling and coordination of care.  Brayton Mars, MD  Note: This dictation was prepared with Dragon dictation along with smaller phrase technology. Any transcriptional errors that result from this process are unintentional.

## 2018-01-09 NOTE — Patient Instructions (Signed)
1.  Diagnostic mammogram and ultrasound are ordered today to assess breast tenderness (nipple tenderness) and possible nodule in left areola 2.  Return in 4 weeks for annual exam 3.  Minimize intake of caffeine and chocolate

## 2018-01-12 ENCOUNTER — Other Ambulatory Visit: Payer: Self-pay | Admitting: Physician Assistant

## 2018-01-21 ENCOUNTER — Ambulatory Visit
Admission: RE | Admit: 2018-01-21 | Discharge: 2018-01-21 | Disposition: A | Discharge status: Home / Self Care | Payer: BLUE CROSS/BLUE SHIELD | Source: Ambulatory Visit | Attending: Obstetrics and Gynecology | Admitting: Obstetrics and Gynecology

## 2018-01-21 ENCOUNTER — Other Ambulatory Visit: Payer: Self-pay | Admitting: Obstetrics and Gynecology

## 2018-01-21 DIAGNOSIS — N644 Mastodynia: Secondary | ICD-10-CM

## 2018-01-21 DIAGNOSIS — N63 Unspecified lump in unspecified breast: Secondary | ICD-10-CM

## 2018-01-22 ENCOUNTER — Other Ambulatory Visit: Payer: Self-pay | Admitting: Obstetrics and Gynecology

## 2018-01-22 DIAGNOSIS — R928 Other abnormal and inconclusive findings on diagnostic imaging of breast: Secondary | ICD-10-CM

## 2018-01-22 DIAGNOSIS — N6310 Unspecified lump in the right breast, unspecified quadrant: Secondary | ICD-10-CM

## 2018-01-28 ENCOUNTER — Ambulatory Visit
Admission: RE | Admit: 2018-01-28 | Discharge: 2018-01-28 | Disposition: A | Discharge status: Home / Self Care | Payer: BLUE CROSS/BLUE SHIELD | Source: Ambulatory Visit | Attending: Obstetrics and Gynecology | Admitting: Obstetrics and Gynecology

## 2018-01-28 DIAGNOSIS — R928 Other abnormal and inconclusive findings on diagnostic imaging of breast: Secondary | ICD-10-CM | POA: Insufficient documentation

## 2018-01-28 DIAGNOSIS — N6310 Unspecified lump in the right breast, unspecified quadrant: Secondary | ICD-10-CM

## 2018-01-28 HISTORY — PX: BREAST BIOPSY: SHX20

## 2018-01-29 LAB — SURGICAL PATHOLOGY

## 2018-02-11 ENCOUNTER — Ambulatory Visit (INDEPENDENT_AMBULATORY_CARE_PROVIDER_SITE_OTHER): Payer: BLUE CROSS/BLUE SHIELD | Admitting: Obstetrics and Gynecology

## 2018-02-11 ENCOUNTER — Encounter: Payer: Self-pay | Admitting: Obstetrics and Gynecology

## 2018-02-11 VITALS — BP 106/66 | HR 80 | Ht 63.0 in | Wt 151.9 lb

## 2018-02-11 DIAGNOSIS — N809 Endometriosis, unspecified: Secondary | ICD-10-CM | POA: Diagnosis not present

## 2018-02-11 DIAGNOSIS — Z01419 Encounter for gynecological examination (general) (routine) without abnormal findings: Secondary | ICD-10-CM

## 2018-02-11 DIAGNOSIS — Z803 Family history of malignant neoplasm of breast: Secondary | ICD-10-CM

## 2018-02-11 DIAGNOSIS — Z90710 Acquired absence of both cervix and uterus: Secondary | ICD-10-CM | POA: Diagnosis not present

## 2018-02-11 NOTE — Patient Instructions (Signed)
1.  No Pap smear is done.  No further Pap smears are needed 2.  Mammogram already completed along with biopsy-benign 3.  Screening labs are to be obtained to primary care 4.  Continue with healthy eating exercise 5.  Return in 1 year for annual exam  Health Maintenance, Female Adopting a healthy lifestyle and getting preventive care can go a long way to promote health and wellness. Talk with your health care provider about what schedule of regular examinations is right for you. This is a good chance for you to check in with your provider about disease prevention and staying healthy. In between checkups, there are plenty of things you can do on your own. Experts have done a lot of research about which lifestyle changes and preventive measures are most likely to keep you healthy. Ask your health care provider for more information. Weight and diet Eat a healthy diet  Be sure to include plenty of vegetables, fruits, low-fat dairy products, and lean protein.  Do not eat a lot of foods high in solid fats, added sugars, or salt.  Get regular exercise. This is one of the most important things you can do for your health. ? Most adults should exercise for at least 150 minutes each week. The exercise should increase your heart rate and make you sweat (moderate-intensity exercise). ? Most adults should also do strengthening exercises at least twice a week. This is in addition to the moderate-intensity exercise.  Maintain a healthy weight  Body mass index (BMI) is a measurement that can be used to identify possible weight problems. It estimates body fat based on height and weight. Your health care provider can help determine your BMI and help you achieve or maintain a healthy weight.  For females 74 years of age and older: ? A BMI below 18.5 is considered underweight. ? A BMI of 18.5 to 24.9 is normal. ? A BMI of 25 to 29.9 is considered overweight. ? A BMI of 30 and above is considered  obese.  Watch levels of cholesterol and blood lipids  You should start having your blood tested for lipids and cholesterol at 45 years of age, then have this test every 5 years.  You may need to have your cholesterol levels checked more often if: ? Your lipid or cholesterol levels are high. ? You are older than 45 years of age. ? You are at high risk for heart disease.  Cancer screening Lung Cancer  Lung cancer screening is recommended for adults 68-50 years old who are at high risk for lung cancer because of a history of smoking.  A yearly low-dose CT scan of the lungs is recommended for people who: ? Currently smoke. ? Have quit within the past 15 years. ? Have at least a 30-pack-year history of smoking. A pack year is smoking an average of one pack of cigarettes a day for 1 year.  Yearly screening should continue until it has been 15 years since you quit.  Yearly screening should stop if you develop a health problem that would prevent you from having lung cancer treatment.  Breast Cancer  Practice breast self-awareness. This means understanding how your breasts normally appear and feel.  It also means doing regular breast self-exams. Let your health care provider know about any changes, no matter how small.  If you are in your 20s or 30s, you should have a clinical breast exam (CBE) by a health care provider every 1-3 years as part of a  regular health exam.  If you are 40 or older, have a CBE every year. Also consider having a breast X-ray (mammogram) every year.  If you have a family history of breast cancer, talk to your health care provider about genetic screening.  If you are at high risk for breast cancer, talk to your health care provider about having an MRI and a mammogram every year.  Breast cancer gene (BRCA) assessment is recommended for women who have family members with BRCA-related cancers. BRCA-related cancers  include: ? Breast. ? Ovarian. ? Tubal. ? Peritoneal cancers.  Results of the assessment will determine the need for genetic counseling and BRCA1 and BRCA2 testing.  Cervical Cancer Your health care provider may recommend that you be screened regularly for cancer of the pelvic organs (ovaries, uterus, and vagina). This screening involves a pelvic examination, including checking for microscopic changes to the surface of your cervix (Pap test). You may be encouraged to have this screening done every 3 years, beginning at age 12.  For women ages 47-65, health care providers may recommend pelvic exams and Pap testing every 3 years, or they may recommend the Pap and pelvic exam, combined with testing for human papilloma virus (HPV), every 5 years. Some types of HPV increase your risk of cervical cancer. Testing for HPV may also be done on women of any age with unclear Pap test results.  Other health care providers may not recommend any screening for nonpregnant women who are considered low risk for pelvic cancer and who do not have symptoms. Ask your health care provider if a screening pelvic exam is right for you.  If you have had past treatment for cervical cancer or a condition that could lead to cancer, you need Pap tests and screening for cancer for at least 20 years after your treatment. If Pap tests have been discontinued, your risk factors (such as having a new sexual partner) need to be reassessed to determine if screening should resume. Some women have medical problems that increase the chance of getting cervical cancer. In these cases, your health care provider may recommend more frequent screening and Pap tests.  Colorectal Cancer  This type of cancer can be detected and often prevented.  Routine colorectal cancer screening usually begins at 45 years of age and continues through 45 years of age.  Your health care provider may recommend screening at an earlier age if you have risk factors  for colon cancer.  Your health care provider may also recommend using home test kits to check for hidden blood in the stool.  A small camera at the end of a tube can be used to examine your colon directly (sigmoidoscopy or colonoscopy). This is done to check for the earliest forms of colorectal cancer.  Routine screening usually begins at age 23.  Direct examination of the colon should be repeated every 5-10 years through 45 years of age. However, you may need to be screened more often if early forms of precancerous polyps or small growths are found.  Skin Cancer  Check your skin from head to toe regularly.  Tell your health care provider about any new moles or changes in moles, especially if there is a change in a mole's shape or color.  Also tell your health care provider if you have a mole that is larger than the size of a pencil eraser.  Always use sunscreen. Apply sunscreen liberally and repeatedly throughout the day.  Protect yourself by wearing long sleeves, pants,  a wide-brimmed hat, and sunglasses whenever you are outside.  Heart disease, diabetes, and high blood pressure  High blood pressure causes heart disease and increases the risk of stroke. High blood pressure is more likely to develop in: ? People who have blood pressure in the high end of the normal range (130-139/85-89 mm Hg). ? People who are overweight or obese. ? People who are African American.  If you are 66-67 years of age, have your blood pressure checked every 3-5 years. If you are 21 years of age or older, have your blood pressure checked every year. You should have your blood pressure measured twice-once when you are at a hospital or clinic, and once when you are not at a hospital or clinic. Record the average of the two measurements. To check your blood pressure when you are not at a hospital or clinic, you can use: ? An automated blood pressure machine at a pharmacy. ? A home blood pressure monitor.  If  you are between 96 years and 32 years old, ask your health care provider if you should take aspirin to prevent strokes.  Have regular diabetes screenings. This involves taking a blood sample to check your fasting blood sugar level. ? If you are at a normal weight and have a low risk for diabetes, have this test once every three years after 45 years of age. ? If you are overweight and have a high risk for diabetes, consider being tested at a younger age or more often. Preventing infection Hepatitis B  If you have a higher risk for hepatitis B, you should be screened for this virus. You are considered at high risk for hepatitis B if: ? You were born in a country where hepatitis B is common. Ask your health care provider which countries are considered high risk. ? Your parents were born in a high-risk country, and you have not been immunized against hepatitis B (hepatitis B vaccine). ? You have HIV or AIDS. ? You use needles to inject street drugs. ? You live with someone who has hepatitis B. ? You have had sex with someone who has hepatitis B. ? You get hemodialysis treatment. ? You take certain medicines for conditions, including cancer, organ transplantation, and autoimmune conditions.  Hepatitis C  Blood testing is recommended for: ? Everyone born from [birth year] through 1963. ? Anyone with known risk factors for hepatitis C.  Sexually transmitted infections (STIs)  You should be screened for sexually transmitted infections (STIs) including gonorrhea and chlamydia if: ? You are sexually active and are younger than 45 years of age. ? You are older than 45 years of age and your health care provider tells you that you are at risk for this type of infection. ? Your sexual activity has changed since you were last screened and you are at an increased risk for chlamydia or gonorrhea. Ask your health care provider if you are at risk.  If you do not have HIV, but are at risk, it may be recommended  that you take a prescription medicine daily to prevent HIV infection. This is called pre-exposure prophylaxis (PrEP). You are considered at risk if: ? You are sexually active and do not regularly use condoms or know the HIV status of your partner(s). ? You take drugs by injection. ? You are sexually active with a partner who has HIV.  Talk with your health care provider about whether you are at high risk of being infected with HIV. If you choose  to begin PrEP, you should first be tested for HIV. You should then be tested every 3 months for as long as you are taking PrEP. Pregnancy  If you are premenopausal and you may become pregnant, ask your health care provider about preconception counseling.  If you may become pregnant, take 400 to 800 micrograms (mcg) of folic acid every day.  If you want to prevent pregnancy, talk to your health care provider about birth control (contraception). Osteoporosis and menopause  Osteoporosis is a disease in which the bones lose minerals and strength with aging. This can result in serious bone fractures. Your risk for osteoporosis can be identified using a bone density scan.  If you are 26 years of age or older, or if you are at risk for osteoporosis and fractures, ask your health care provider if you should be screened.  Ask your health care provider whether you should take a calcium or vitamin D supplement to lower your risk for osteoporosis.  Menopause may have certain physical symptoms and risks.  Hormone replacement therapy may reduce some of these symptoms and risks. Talk to your health care provider about whether hormone replacement therapy is right for you. Follow these instructions at home:  Schedule regular health, dental, and eye exams.  Stay current with your immunizations.  Do not use any tobacco products including cigarettes, chewing tobacco, or electronic cigarettes.  If you are pregnant, do not drink alcohol.  If you are  breastfeeding, limit how much and how often you drink alcohol.  Limit alcohol intake to no more than 1 drink per day for nonpregnant women. One drink equals 12 ounces of beer, 5 ounces of wine, or 1 ounces of hard liquor.  Do not use street drugs.  Do not share needles.  Ask your health care provider for help if you need support or information about quitting drugs.  Tell your health care provider if you often feel depressed.  Tell your health care provider if you have ever been abused or do not feel safe at home. This information is not intended to replace advice given to you by your health care provider. Make sure you discuss any questions you have with your health care provider. Document Released: 02/05/2011 Document Revised: 12/29/2015 Document Reviewed: 04/26/2015 Elsevier Interactive Patient Education  [employer].

## 2018-02-11 NOTE — Progress Notes (Signed)
ANNUAL PREVENTATIVE CARE GYN  ENCOUNTER NOTE  Subjective:       Vicki Reese is a 47 y.o. [id number] female here for a routine annual gynecologic exam.  Current complaints: 1.  Recent BI-RADS 4 mammogram; subsequent biopsy was negative; routine follow-up yearly is recommended  No major interval health issues.  Bowel function is normal.  Bladder function is normal.  She is not experiencing any vasomotor symptoms.   Gynecologic History Patient's last menstrual period was 06/06/2016 (approximate). Contraception: status Reese hysterectomy-no evidence of dysplasia Last mammogram:  01/21/2018 birad4- breast bx neg (fibrocystic change; columnar cell change; pseudo-angiomatous stromal hyperplasia; negative for atypia and malignancy)  Obstetric History OB History  Gravida Para Term Preterm AB Living  3 2 2   1 2   SAB TAB Ectopic Multiple Live Births  1       2    # Outcome Date GA Lbr Len/2nd Weight Sex Delivery Anes PTL Lv  3 Term 2007   10 lb (4.536 kg) M Vag-Spont   LIV  2 SAB 2006          1 Term 1998   8 lb 1.9 oz (3.683 kg) M Vag-Spont   LIV    Past Medical History:  Diagnosis Date  . Acid reflux   . Anxiety   . Menstrual migraine 11/07/2015    Past Surgical History:  Procedure Laterality Date  . BREAST BIOPSY Right 01/28/2018   pending path  . DIAGNOSTIC LAPAROSCOPY    . DILATION AND CURETTAGE OF UTERUS    . LAPAROSCOPIC ASSISTED VAGINAL HYSTERECTOMY N/A 06/18/2016   Procedure: LAPAROSCOPIC ASSISTED VAGINAL HYSTERECTOMY WITH BILATERAL SALPINGECTOMY;  Surgeon: Brayton Mars, MD;  Location: [facility] ORS;  Service: Gynecology;  Laterality: N/A;  . laparoscopy      Current Outpatient Medications on File Prior to Visit  Medication Sig Dispense Refill  . buPROPion (WELLBUTRIN XL) 300 MG 24 hr tablet TAKE 1 TABLET (300 MG TOTAL) BY MOUTH DAILY. 90 tablet 1  . ibuprofen (ADVIL,MOTRIN) 200 MG tablet Take 800 mg by mouth every 6 (six) hours as needed for mild pain.    .  pantoprazole (PROTONIX) 40 MG tablet Take 1 tablet (40 mg total) by mouth daily. 90 tablet 1   No current facility-administered medications on file prior to visit.     No Known Allergies  Social History   Socioeconomic History  . Marital status: Legally Separated    Spouse name: Not on file  . Number of children: Not on file  . Years of education: Not on file  . Highest education level: Not on file  Occupational History  . Not on file  Social Needs  . Financial resource strain: Not on file  . Food insecurity:    Worry: Not on file    Inability: Not on file  . Transportation needs:    Medical: Not on file    Non-medical: Not on file  Tobacco Use  . Smoking status: Former Smoker    Last attempt to quit: 08/06/2006    Years since quitting: 11.5  . Smokeless tobacco: Never Used  . Tobacco comment: QUIT IN 2008  Substance and Sexual Activity  . Alcohol use: Yes    Alcohol/week: 0.0 oz    Comment: OCCASIONALLY  . Drug use: No  . Sexual activity: Yes    Birth control/protection: Surgical  Lifestyle  . Physical activity:    Days per week: Not on file    Minutes per session: Not on  file  . Stress: Not on file  Relationships  . Social connections:    Talks on phone: Not on file    Gets together: Not on file    Attends religious service: Not on file    Active member of club or organization: Not on file    Attends meetings of clubs or organizations: Not on file    Relationship status: Not on file  . Intimate partner violence:    Fear of current or ex partner: Not on file    Emotionally abused: Not on file    Physically abused: Not on file    Forced sexual activity: Not on file  Other Topics Concern  . Not on file  Social History Narrative  . Not on file    Family History  Problem Relation Age of Onset  . Breast cancer Mother 62  . Lupus Father   . Diabetes Father   . Heart disease Paternal Grandmother   . Ovarian cancer Neg Hx   . Colon cancer Neg Hx     The  following portions of the patient's history were reviewed and updated as appropriate: allergies, current medications, past family history, past medical history, past social history, past surgical history and problem list.  Review of Systems Review of Systems  Constitutional: Negative.   HENT: Negative.   Eyes: Negative.   Respiratory: Negative.   Cardiovascular: Negative.   Gastrointestinal: Negative.   Genitourinary: Negative.   Musculoskeletal: Negative.   Skin: Negative.   Neurological: Negative.   Endo/Heme/Allergies: Negative.   Psychiatric/Behavioral: Negative.     Objective:   BP 106/66   Pulse 80   Ht 5' 3"  (1.6 m)   Wt 151 lb 14.4 oz (68.9 kg)   LMP 06/06/2016 (Approximate)   BMI 26.91 kg/m  CONSTITUTIONAL: Well-developed, well-nourished female in no acute distress.  PSYCHIATRIC: Normal mood and affect. Normal behavior. Normal judgment and thought content. [place]: Alert and oriented to person, place, and time. Normal muscle tone coordination. No cranial nerve deficit noted. HENT:  Normocephalic, atraumatic, External right and left ear normal.  EYES: Conjunctivae and EOM are normal. No scleral icterus.  NECK: Normal range of motion, supple, no masses.  Normal thyroid.  SKIN: Skin is warm and dry. No rash noted. Not diaphoretic. No erythema. No pallor. CARDIOVASCULAR: Normal heart rate noted, regular rhythm, no murmur. RESPIRATORY: Clear to auscultation bilaterally. Effort and breath sounds normal, no problems with respiration noted. BREASTS: Symmetric in size. No masses, skin changes, nipple drainage, or lymphadenopathy.  Right breast bruising at biopsy site is noted, nontender, without palpable mass ABDOMEN: Soft, normal bowel sounds, no distention noted.  No tenderness, rebound or guarding.  BLADDER: Normal PELVIC:  External Genitalia: Normal  BUS: Normal  Vagina: Normal; good vault support; minimal white secretions in vaginal vault; no palpable masses or  tenderness  Cervix: Surgically absent  Uterus: Surgically absent  Adnexa: Normal; nonpalpable nontender  RV: External Exam NormaI, No Rectal Masses and Normal Sphincter tone  MUSCULOSKELETAL: Normal range of motion. No tenderness.  No cyanosis, clubbing, or edema.  2+ distal pulses. LYMPHATIC: No Axillary, Supraclavicular, or Inguinal Adenopathy.    Assessment:   Annual gynecologic examination 47 y.o. Contraception: status Reese hysterectomy (LAVH bilateral salpingectomy) [id number] Problem List Items Addressed This Visit    Endometriosis determined by laparoscopy   Family history of breast cancer in mother    Other Visit Diagnoses    Well woman exam with routine gynecological exam    -  Primary  Recent breast biopsy for BI-RADS 4 mammogram-negative; routine annual follow-up is recommended  Plan:  Pap: Not needed Mammogram: utd Stool Guaiac Testing:  Not Indicated Labs: thru pcp Routine preventative health maintenance measures emphasized: Exercise/Diet/Weight control, Tobacco Warnings and Alcohol/Substance use risks Return to [place], CMA  Brayton Mars, MD  Note: This dictation was prepared with Dragon dictation along with smaller phrase technology. Any transcriptional errors that result from this process are unintentional.

## 2018-02-16 ENCOUNTER — Other Ambulatory Visit: Payer: Self-pay | Admitting: Physician Assistant

## 2018-02-16 DIAGNOSIS — K219 Gastro-esophageal reflux disease without esophagitis: Secondary | ICD-10-CM

## 2018-08-08 ENCOUNTER — Other Ambulatory Visit: Payer: Self-pay | Admitting: Physician Assistant

## 2018-10-21 ENCOUNTER — Encounter: Payer: Self-pay | Admitting: Physician Assistant

## 2018-10-21 DIAGNOSIS — N3000 Acute cystitis without hematuria: Secondary | ICD-10-CM

## 2018-10-22 MED ORDER — CEPHALEXIN 500 MG PO CAPS: 500.0000 mg | ORAL_CAPSULE | Freq: Two times a day (BID) | ORAL | 0 refills | Status: DC

## 2018-11-21 ENCOUNTER — Other Ambulatory Visit: Payer: Self-pay | Admitting: Physician Assistant

## 2018-11-21 DIAGNOSIS — K219 Gastro-esophageal reflux disease without esophagitis: Secondary | ICD-10-CM

## 2019-02-05 ENCOUNTER — Encounter: Payer: Self-pay | Admitting: Physician Assistant

## 2019-02-09 ENCOUNTER — Other Ambulatory Visit: Payer: Self-pay | Admitting: Physician Assistant

## 2019-02-09 DIAGNOSIS — Z1231 Encounter for screening mammogram for malignant neoplasm of breast: Secondary | ICD-10-CM

## 2019-02-14 ENCOUNTER — Other Ambulatory Visit: Payer: Self-pay | Admitting: Physician Assistant

## 2019-03-18 ENCOUNTER — Ambulatory Visit
Admission: RE | Admit: 2019-03-18 | Discharge: 2019-03-18 | Disposition: A | Discharge status: Home / Self Care | Payer: Self-pay | Source: Ambulatory Visit | Attending: Physician Assistant | Admitting: Physician Assistant

## 2019-03-18 DIAGNOSIS — Z1231 Encounter for screening mammogram for malignant neoplasm of breast: Secondary | ICD-10-CM | POA: Insufficient documentation

## 2019-03-19 ENCOUNTER — Telehealth: Payer: Self-pay

## 2019-03-19 NOTE — Telephone Encounter (Signed)
-----   Message from Mar Daring, PA-C sent at 03/19/2019  8:51 AM EDT ----- Normal mammogram. Repeat screening in one year.

## 2019-03-19 NOTE — Telephone Encounter (Signed)
Viewed by Sheliah Hatch on 03/19/2019 8:54 AM Written by Mar Daring, PA-C on 03/19/2019 8:51 AM Normal mammogram. Repeat screening in one year.

## 2019-05-04 ENCOUNTER — Encounter: Payer: Self-pay | Admitting: Physician Assistant

## 2019-05-04 ENCOUNTER — Other Ambulatory Visit: Payer: Self-pay | Admitting: Physician Assistant

## 2019-05-04 DIAGNOSIS — N3000 Acute cystitis without hematuria: Secondary | ICD-10-CM

## 2019-05-04 NOTE — Telephone Encounter (Signed)
Please review for [place].   Thanks,   -Mickel Baas

## 2019-05-04 NOTE — Telephone Encounter (Signed)
See Mychart message.  Needs appt

## 2019-05-04 NOTE — Telephone Encounter (Signed)
Patient doesn't want an appointment at this time. States that she is trying to avoid doctors offices at this time. Doesn't want to have contact with people/person. She is going to see how her symptoms are going to be and that since she doesn't have a choice she will call back and schedule. She was also asking if her insurance is going to covered since she changed insurance advised patient to call her insurance and to verify with them.

## 2019-05-06 ENCOUNTER — Ambulatory Visit: Payer: BLUE CROSS/BLUE SHIELD | Admitting: Physician Assistant

## 2019-05-06 ENCOUNTER — Encounter: Payer: Self-pay | Admitting: Physician Assistant

## 2019-05-06 ENCOUNTER — Other Ambulatory Visit: Payer: Self-pay

## 2019-05-06 VITALS — BP 118/82 | HR 80 | Temp 97.1°F | Wt 151.0 lb

## 2019-05-06 DIAGNOSIS — N3090 Cystitis, unspecified without hematuria: Secondary | ICD-10-CM

## 2019-05-06 LAB — POCT URINALYSIS DIPSTICK
Bilirubin, UA: NEGATIVE
Glucose, UA: NEGATIVE
Ketones, UA: NEGATIVE
Nitrite, UA: NEGATIVE
Protein, UA: NEGATIVE
Spec Grav, UA: 1.01 (ref 1.010–1.025)
Urobilinogen, UA: 0.2 E.U./dL
pH, UA: 7.5 (ref 5.0–8.0)

## 2019-05-06 MED ORDER — CEPHALEXIN 500 MG PO CAPS: 500.0000 mg | ORAL_CAPSULE | Freq: Two times a day (BID) | ORAL | 0 refills | Status: DC

## 2019-05-06 NOTE — Patient Instructions (Signed)
Urinary Tract Infection, Adult A urinary tract infection (UTI) is an infection of any part of the urinary tract. The urinary tract includes:  The kidneys.  The ureters.  The bladder.  The urethra. These organs make, store, and get rid of pee (urine) in the body. What are the causes? This is caused by germs (bacteria) in your genital area. These germs grow and cause swelling (inflammation) of your urinary tract. What increases the risk? You are more likely to develop this condition if:  You have a small, thin tube (catheter) to drain pee.  You cannot control when you pee or poop (incontinence).  You are female, and: ? You use these methods to prevent pregnancy: ? A medicine that kills sperm (spermicide). ? A device that blocks sperm (diaphragm). ? You have low levels of a female hormone (estrogen). ? You are pregnant.  You have genes that add to your risk.  You are sexually active.  You take antibiotic medicines.  You have trouble peeing because of: ? A prostate that is bigger than normal, if you are female. ? A blockage in the part of your body that drains pee from the bladder (urethra). ? A kidney stone. ? A nerve condition that affects your bladder (neurogenic bladder). ? Not getting enough to drink. ? Not peeing often enough.  You have other conditions, such as: ? Diabetes. ? A weak disease-fighting system (immune system). ? Sickle cell disease. ? Gout. ? Injury of the spine. What are the signs or symptoms? Symptoms of this condition include:  Needing to pee right away (urgently).  Peeing often.  Peeing small amounts often.  Pain or burning when peeing.  Blood in the pee.  Pee that smells bad or not like normal.  Trouble peeing.  Pee that is cloudy.  Fluid coming from the vagina, if you are female.  Pain in the belly or lower back. Other symptoms include:  Throwing up (vomiting).  No urge to eat.  Feeling mixed up (confused).  Being tired  and grouchy (irritable).  A fever.  Watery poop (diarrhea). How is this treated? This condition may be treated with:  Antibiotic medicine.  Other medicines.  Drinking enough water. Follow these instructions at home:  Medicines  Take over-the-counter and prescription medicines only as told by your doctor.  If you were prescribed an antibiotic medicine, take it as told by your doctor. Do not stop taking it even if you start to feel better. General instructions  Make sure you: ? Pee until your bladder is empty. ? Do not hold pee for a long time. ? Empty your bladder after sex. ? Wipe from front to back after pooping if you are a female. Use each tissue one time when you wipe.  Drink enough fluid to keep your pee pale yellow.  Keep all follow-up visits as told by your doctor. This is important. Contact a doctor if:  You do not get better after 1-2 days.  Your symptoms go away and then come back. Get help right away if:  You have very bad back pain.  You have very bad pain in your lower belly.  You have a fever.  You are sick to your stomach (nauseous).  You are throwing up. Summary  A urinary tract infection (UTI) is an infection of any part of the urinary tract.  This condition is caused by germs in your genital area.  There are many risk factors for a UTI. These include having a small, thin   tube to drain pee and not being able to control when you pee or poop.  Treatment includes antibiotic medicines for germs.  Drink enough fluid to keep your pee pale yellow. This information is not intended to replace advice given to you by your health care provider. Make sure you discuss any questions you have with your health care provider. Document Released: 01/09/2008 Document Revised: 07/10/2018 Document Reviewed: 01/30/2018 Elsevier Patient Education  2020 Elsevier Inc[employer]  

## 2019-05-06 NOTE — Progress Notes (Signed)
Patient: Vicki Reese Female    DOB: [birth year]   48 y.o.   MRN: [id number] Visit Date: 05/06/2019  Today's Provider: Mar Daring, PA-C   Chief Complaint  Patient presents with  . Urinary Tract Infection   Subjective:     Urinary Tract Infection  This is a new problem. The current episode started in the past 7 days. The problem has been waxing and waning. There has been no fever. There is no history of pyelonephritis. Associated symptoms include frequency and urgency. Pertinent negatives include no chills, discharge, flank pain, hematuria, nausea or vomiting.    No Known Allergies   Current Outpatient Medications:  .  buPROPion (WELLBUTRIN XL) 300 MG 24 hr tablet, TAKE 1 TABLET BY MOUTH EVERY DAY, Disp: 90 tablet, Rfl: 1 .  ibuprofen (ADVIL,MOTRIN) 200 MG tablet, Take 800 mg by mouth every 6 (six) hours as needed for mild pain., Disp: , Rfl:  .  pantoprazole (PROTONIX) 40 MG tablet, TAKE 1 TABLET BY MOUTH EVERY DAY, Disp: 90 tablet, Rfl: 1 .  cephALEXin (KEFLEX) 500 MG capsule, Take 1 capsule (500 mg total) by mouth 2 (two) times daily., Disp: 10 capsule, Rfl: 0  Review of Systems  Constitutional: Negative.  Negative for chills and fever.  Gastrointestinal: Positive for abdominal pain. Negative for abdominal distention, anal bleeding, blood in stool, constipation, diarrhea, nausea, rectal pain and vomiting.  Genitourinary: Positive for dysuria, frequency and urgency. Negative for difficulty urinating, dyspareunia, enuresis, flank pain, genital sores, hematuria, menstrual problem, pelvic pain, vaginal bleeding, vaginal discharge and vaginal pain.  Musculoskeletal: Positive for back pain.    Social History   Tobacco Use  . Smoking status: Former Smoker    Quit date: 08/06/2006    Years since quitting: 12.7  . Smokeless tobacco: Never Used  . Tobacco comment: QUIT IN 2008  Substance Use Topics  . Alcohol use: Yes    Alcohol/week: 0.0 standard drinks   Comment: OCCASIONALLY      Objective:   BP 118/82 (BP Location: Left Arm, Patient Position: Sitting, Cuff Size: Normal)   Pulse 80   Temp (!) 97.1 F (36.2 C) (Temporal)   Wt 151 lb (68.5 kg)   LMP 06/06/2016 (Approximate)   BMI 26.75 kg/m  Vitals:   05/06/19 0845  BP: 118/82  Pulse: 80  Temp: (!) 97.1 F (36.2 C)  TempSrc: Temporal  Weight: 151 lb (68.5 kg)  Body mass index is 26.75 kg/m.   Physical Exam Constitutional:      General: She is not in acute distress.    Appearance: Normal appearance. She is well-developed. She is not ill-appearing or diaphoretic.  Cardiovascular:     Rate and Rhythm: Normal rate and regular rhythm.     Heart sounds: Normal heart sounds. No murmur. No friction rub. No gallop.   Pulmonary:     Effort: Pulmonary effort is normal. No respiratory distress.     Breath sounds: Normal breath sounds. No wheezing or rales.  Abdominal:     General: Bowel sounds are normal. There is no distension.     Palpations: Abdomen is soft. There is no mass.     Tenderness: There is abdominal tenderness in the suprapubic area. There is no guarding or rebound.  Skin:    General: Skin is warm and dry.  Neurological:     Mental Status: She is alert and oriented to person, place, and time.      Results for orders placed  or performed in visit on 05/06/19  POCT urinalysis dipstick  Result Value Ref Range   Color, UA     Clarity, UA     Glucose, UA Negative Negative   Bilirubin, UA Negative    Ketones, UA Negative    Spec Grav, UA 1.010 1.010 - 1.025   Blood, UA Moderate    pH, UA 7.5 5.0 - 8.0   Protein, UA Negative Negative   Urobilinogen, UA 0.2 0.2 or 1.0 E.U./dL   Nitrite, UA Negative    Leukocytes, UA Moderate (2+) (A) Negative   Appearance     Odor         Assessment & Plan    1. Cystitis Worsening symptoms. UA positive. Will treat empirically with Keflex as below. Continue to push fluids. Urine sent for culture. Will follow up pending C&S  results. She is to call if symptoms do not improve or if they worsen.  - CULTURE, URINE COMPREHENSIVE - cephALEXin (KEFLEX) 500 MG capsule; Take 1 capsule (500 mg total) by mouth 2 (two) times daily.  Dispense: 10 capsule; Refill: 0     Mar Daring, PA-C  [facility]

## 2019-05-09 LAB — CULTURE, URINE COMPREHENSIVE

## 2019-05-11 ENCOUNTER — Telehealth: Payer: Self-pay

## 2019-05-11 NOTE — Telephone Encounter (Signed)
-----   Message from Mar Daring, [place] sent at 05/11/2019 10:41 AM EDT ----- Urine culture was positive for ecoli and is sensitive to the antibiotic you were placed on. Continue until completed. Call if not completely resolved or if symptoms return.

## 2019-05-11 NOTE — Telephone Encounter (Signed)
Viewed by Sheliah Hatch on 05/11/2019 10:42 AM Written by Mar Daring, PA-C on 05/11/2019 10:41 AM Urine culture was positive for ecoli and is sensitive to the antibiotic you were placed on. Continue until completed. Call if not completely resolved or if symptoms return.

## 2019-05-12 ENCOUNTER — Other Ambulatory Visit: Payer: Self-pay | Admitting: Physician Assistant

## 2019-05-12 DIAGNOSIS — K219 Gastro-esophageal reflux disease without esophagitis: Secondary | ICD-10-CM

## 2019-08-17 ENCOUNTER — Other Ambulatory Visit: Payer: Self-pay | Admitting: Physician Assistant

## 2019-09-08 ENCOUNTER — Encounter: Payer: Self-pay | Admitting: Physician Assistant

## 2019-09-10 ENCOUNTER — Ambulatory Visit (INDEPENDENT_AMBULATORY_CARE_PROVIDER_SITE_OTHER): Payer: 59 | Admitting: Physician Assistant

## 2019-09-10 ENCOUNTER — Encounter: Payer: Self-pay | Admitting: Physician Assistant

## 2019-09-10 ENCOUNTER — Other Ambulatory Visit: Payer: Self-pay

## 2019-09-10 VITALS — BP 128/83 | HR 87 | Temp 97.0°F | Resp 16 | Wt 154.6 lb

## 2019-09-10 DIAGNOSIS — R3989 Other symptoms and signs involving the genitourinary system: Secondary | ICD-10-CM | POA: Diagnosis not present

## 2019-09-10 DIAGNOSIS — R300 Dysuria: Secondary | ICD-10-CM | POA: Diagnosis not present

## 2019-09-10 LAB — POCT URINALYSIS DIPSTICK
Bilirubin, UA: NEGATIVE
Blood, UA: NEGATIVE
Glucose, UA: NEGATIVE
Ketones, UA: NEGATIVE
Leukocytes, UA: NEGATIVE
Nitrite, UA: NEGATIVE
Protein, UA: NEGATIVE
Spec Grav, UA: 1.01 (ref 1.010–1.025)
Urobilinogen, UA: 0.2 E.U./dL
pH, UA: 7 (ref 5.0–8.0)

## 2019-09-10 MED ORDER — CEPHALEXIN 500 MG PO CAPS: 500.0000 mg | ORAL_CAPSULE | Freq: Four times a day (QID) | ORAL | 0 refills | Status: DC

## 2019-09-10 NOTE — Progress Notes (Signed)
Patient: Vicki Reese Female    DOB: January 21, 1971   49 y.o.   MRN: [id number] Visit Date: 09/10/2019  Today's Provider: Mar Daring, PA-C   Chief Complaint  Patient presents with  . Dysuria   Subjective:     Dysuria  This is a new problem. The current episode started in the past 7 days (Tuesday). The problem has been gradually improving. The quality of the pain is described as aching and burning (at the end of the stream with spasms in her bladder). There has been no fever. Pertinent negatives include no discharge, flank pain, frequency, hematuria, nausea, urgency or vomiting. She has tried increased fluids (Azo cranberry) for the symptoms. The treatment provided mild relief. Her past medical history is significant for recurrent UTIs.    No Known Allergies   Current Outpatient Medications:  .  buPROPion (WELLBUTRIN XL) 300 MG 24 hr tablet, TAKE 1 TABLET BY MOUTH EVERY DAY, Disp: 90 tablet, Rfl: 1 .  ibuprofen (ADVIL,MOTRIN) 200 MG tablet, Take 800 mg by mouth every 6 (six) hours as needed for mild pain., Disp: , Rfl:  .  pantoprazole (PROTONIX) 40 MG tablet, TAKE 1 TABLET BY MOUTH EVERY DAY, Disp: 90 tablet, Rfl: 1  Review of Systems  Constitutional: Negative.   Respiratory: Negative.   Cardiovascular: Negative.   Gastrointestinal: Negative for abdominal pain, constipation, diarrhea, nausea and vomiting.  Genitourinary: Positive for dysuria and pelvic pain. Negative for flank pain, frequency, hematuria and urgency.  Musculoskeletal: Positive for back pain (low back).    Social History   Tobacco Use  . Smoking status: Former Smoker    Quit date: 08/06/2006    Years since quitting: 13.1  . Smokeless tobacco: Never Used  . Tobacco comment: QUIT IN 2008  Substance Use Topics  . Alcohol use: Yes    Alcohol/week: 0.0 standard drinks    Comment: OCCASIONALLY      Objective:   BP 128/83 (BP Location: Left Arm, Patient Position: Sitting, Cuff Size: Large)    Pulse 87   Temp (!) 97 F (36.1 C) (Temporal)   Resp 16   Wt 154 lb 9.6 oz (70.1 kg)   LMP 06/06/2016 (Approximate)   BMI 27.39 kg/m  Vitals:   09/10/19 0842  BP: 128/83  Pulse: 87  Resp: 16  Temp: (!) 97 F (36.1 C)  TempSrc: Temporal  Weight: 154 lb 9.6 oz (70.1 kg)  Body mass index is 27.39 kg/m.   Physical Exam Constitutional:      General: She is not in acute distress.    Appearance: Normal appearance. She is well-developed and normal weight. She is not ill-appearing or diaphoretic.  Cardiovascular:     Rate and Rhythm: Normal rate and regular rhythm.     Heart sounds: Normal heart sounds. No murmur. No friction rub. No gallop.   Pulmonary:     Effort: Pulmonary effort is normal. No respiratory distress.     Breath sounds: Normal breath sounds. No wheezing or rales.  Abdominal:     General: Bowel sounds are normal. There is no distension.     Palpations: Abdomen is soft. There is no mass.     Tenderness: There is abdominal tenderness in the suprapubic area. There is no right CVA tenderness, left CVA tenderness, guarding or rebound.  Skin:    General: Skin is warm and dry.  Neurological:     Mental Status: She is alert and oriented to person, place, and time.  Results for orders placed or performed in visit on 09/10/19  POCT urinalysis dipstick  Result Value Ref Range   Color, UA light Yellow    Clarity, UA Clear    Glucose, UA Negative Negative   Bilirubin, UA Negative    Ketones, UA Negative    Spec Grav, UA 1.010 1.010 - 1.025   Blood, UA Negative    pH, UA 7.0 5.0 - 8.0   Protein, UA Negative Negative   Urobilinogen, UA 0.2 0.2 or 1.0 E.U./dL   Nitrite, UA Negative    Leukocytes, UA Negative Negative   Appearance     Odor         Assessment & Plan    1. Dysuria UA unremarkable but patient reports feels similar to previous UTIs and has been on AZO that may have skewed the results. Will send for culture as below and f/u pending results. Will  give Keflex as below for empiric treatment.  - Urine Culture - cephALEXin (KEFLEX) 500 MG capsule; Take 1 capsule (500 mg total) by mouth 4 (four) times daily.  Dispense: 14 capsule; Refill: 0  2. Suspected UTI See above medical treatment plan. - cephALEXin (KEFLEX) 500 MG capsule; Take 1 capsule (500 mg total) by mouth 4 (four) times daily.  Dispense: 14 capsule; Refill: 0    Mar Daring, PA-C  [facility]

## 2019-09-10 NOTE — Patient Instructions (Signed)
Urinary Tract Infection, Adult A urinary tract infection (UTI) is an infection of any part of the urinary tract. The urinary tract includes:  The kidneys.  The ureters.  The bladder.  The urethra. These organs make, store, and get rid of pee (urine) in the body. What are the causes? This is caused by germs (bacteria) in your genital area. These germs grow and cause swelling (inflammation) of your urinary tract. What increases the risk? You are more likely to develop this condition if:  You have a small, thin tube (catheter) to drain pee.  You cannot control when you pee or poop (incontinence).  You are female, and: ? You use these methods to prevent pregnancy:  A medicine that kills sperm (spermicide).  A device that blocks sperm (diaphragm). ? You have low levels of a female hormone (estrogen). ? You are pregnant.  You have genes that add to your risk.  You are sexually active.  You take antibiotic medicines.  You have trouble peeing because of: ? A prostate that is bigger than normal, if you are female. ? A blockage in the part of your body that drains pee from the bladder (urethra). ? A kidney stone. ? A nerve condition that affects your bladder (neurogenic bladder). ? Not getting enough to drink. ? Not peeing often enough.  You have other conditions, such as: ? Diabetes. ? A weak disease-fighting system (immune system). ? Sickle cell disease. ? Gout. ? Injury of the spine. What are the signs or symptoms? Symptoms of this condition include:  Needing to pee right away (urgently).  Peeing often.  Peeing small amounts often.  Pain or burning when peeing.  Blood in the pee.  Pee that smells bad or not like normal.  Trouble peeing.  Pee that is cloudy.  Fluid coming from the vagina, if you are female.  Pain in the belly or lower back. Other symptoms include:  Throwing up (vomiting).  No urge to eat.  Feeling mixed up (confused).  Being tired  and grouchy (irritable).  A fever.  Watery poop (diarrhea). How is this treated? This condition may be treated with:  Antibiotic medicine.  Other medicines.  Drinking enough water. Follow these instructions at home:  Medicines  Take over-the-counter and prescription medicines only as told by your doctor.  If you were prescribed an antibiotic medicine, take it as told by your doctor. Do not stop taking it even if you start to feel better. General instructions  Make sure you: ? Pee until your bladder is empty. ? Do not hold pee for a long time. ? Empty your bladder after sex. ? Wipe from front to back after pooping if you are a female. Use each tissue one time when you wipe.  Drink enough fluid to keep your pee pale yellow.  Keep all follow-up visits as told by your doctor. This is important. Contact a doctor if:  You do not get better after 1-2 days.  Your symptoms go away and then come back. Get help right away if:  You have very bad back pain.  You have very bad pain in your lower belly.  You have a fever.  You are sick to your stomach (nauseous).  You are throwing up. Summary  A urinary tract infection (UTI) is an infection of any part of the urinary tract.  This condition is caused by germs in your genital area.  There are many risk factors for a UTI. These include having a small, thin  tube to drain pee and not being able to control when you pee or poop.  Treatment includes antibiotic medicines for germs.  Drink enough fluid to keep your pee pale yellow. This information is not intended to replace advice given to you by your health care provider. Make sure you discuss any questions you have with your health care provider. Document Revised: 07/10/2018 Document Reviewed: 01/30/2018 Elsevier Patient Education  2020 [employer].

## 2019-09-12 LAB — URINE CULTURE

## 2019-09-14 ENCOUNTER — Telehealth: Payer: Self-pay

## 2019-09-14 NOTE — Telephone Encounter (Signed)
Called patient and no answer left voicemail for patient to return call. If patient returns call om for [facility] to advise patient of results.

## 2019-09-14 NOTE — Telephone Encounter (Signed)
Pt given result per Fenton Malling; she verbalized information; not able to chart in result note because encounter not created.

## 2019-09-14 NOTE — Telephone Encounter (Signed)
Seen by patient Vicki Reese on 09/14/2019 10:05 AM EST

## 2019-09-14 NOTE — Telephone Encounter (Signed)
-----   Message from Mar Daring, [place] sent at 09/14/2019  7:20 AM EST ----- Urine culture is positive for ecoli bacteria again and is sensitive to the antibiotic you were placed on. Continue until completed and call if symptoms fail to resolve or worsen.

## 2019-11-14 ENCOUNTER — Other Ambulatory Visit: Payer: Self-pay | Admitting: Physician Assistant

## 2019-11-14 DIAGNOSIS — K219 Gastro-esophageal reflux disease without esophagitis: Secondary | ICD-10-CM

## 2019-11-14 NOTE — Telephone Encounter (Signed)
Requested Prescriptions  Pending Prescriptions Disp Refills  . pantoprazole (PROTONIX) 40 MG tablet [Pharmacy Med Name: PANTOPRAZOLE SOD DR 40 MG TAB] 90 tablet 1    Sig: TAKE 1 TABLET BY MOUTH EVERY DAY     Gastroenterology: Proton Pump Inhibitors Passed - 11/14/2019  9:38 AM      Passed - Valid encounter within last 12 months    Recent Outpatient Visits          2 months ago [place], Clearnce Sorrel, [place]   6 months ago [place], [place], [place]   3 years ago Abnormal menstruation   [place], [place]   3 years ago Acute cystitis without hematuria   [facility] [place], Clearnce Sorrel, [place]   3 years ago Annual physical exam   [facility] Fenton Malling M, [place]

## 2019-12-03 ENCOUNTER — Other Ambulatory Visit: Payer: Self-pay

## 2019-12-03 ENCOUNTER — Ambulatory Visit (INDEPENDENT_AMBULATORY_CARE_PROVIDER_SITE_OTHER): Payer: 59 | Admitting: Physician Assistant

## 2019-12-03 ENCOUNTER — Encounter: Payer: Self-pay | Admitting: Physician Assistant

## 2019-12-03 DIAGNOSIS — N390 Urinary tract infection, site not specified: Secondary | ICD-10-CM

## 2019-12-03 DIAGNOSIS — R300 Dysuria: Secondary | ICD-10-CM

## 2019-12-03 MED ORDER — CEPHALEXIN 500 MG PO CAPS: 500.0000 mg | ORAL_CAPSULE | Freq: Four times a day (QID) | ORAL | 0 refills | Status: DC

## 2019-12-03 MED ORDER — NITROFURANTOIN MONOHYD MACRO 100 MG PO CAPS: 100.0000 mg | ORAL_CAPSULE | Freq: Every day | ORAL | 1 refills | Status: DC

## 2019-12-03 NOTE — Patient Instructions (Signed)
Urinary Tract Infection, Adult A urinary tract infection (UTI) is an infection of any part of the urinary tract. The urinary tract includes:  The kidneys.  The ureters.  The bladder.  The urethra. These organs make, store, and get rid of pee (urine) in the body. What are the causes? This is caused by germs (bacteria) in your genital area. These germs grow and cause swelling (inflammation) of your urinary tract. What increases the risk? You are more likely to develop this condition if:  You have a small, thin tube (catheter) to drain pee.  You cannot control when you pee or poop (incontinence).  You are female, and: ? You use these methods to prevent pregnancy:  A medicine that kills sperm (spermicide).  A device that blocks sperm (diaphragm). ? You have low levels of a female hormone (estrogen). ? You are pregnant.  You have genes that add to your risk.  You are sexually active.  You take antibiotic medicines.  You have trouble peeing because of: ? A prostate that is bigger than normal, if you are female. ? A blockage in the part of your body that drains pee from the bladder (urethra). ? A kidney stone. ? A nerve condition that affects your bladder (neurogenic bladder). ? Not getting enough to drink. ? Not peeing often enough.  You have other conditions, such as: ? Diabetes. ? A weak disease-fighting system (immune system). ? Sickle cell disease. ? Gout. ? Injury of the spine. What are the signs or symptoms? Symptoms of this condition include:  Needing to pee right away (urgently).  Peeing often.  Peeing small amounts often.  Pain or burning when peeing.  Blood in the pee.  Pee that smells bad or not like normal.  Trouble peeing.  Pee that is cloudy.  Fluid coming from the vagina, if you are female.  Pain in the belly or lower back. Other symptoms include:  Throwing up (vomiting).  No urge to eat.  Feeling mixed up (confused).  Being tired  and grouchy (irritable).  A fever.  Watery poop (diarrhea). How is this treated? This condition may be treated with:  Antibiotic medicine.  Other medicines.  Drinking enough water. Follow these instructions at home:  Medicines  Take over-the-counter and prescription medicines only as told by your doctor.  If you were prescribed an antibiotic medicine, take it as told by your doctor. Do not stop taking it even if you start to feel better. General instructions  Make sure you: ? Pee until your bladder is empty. ? Do not hold pee for a long time. ? Empty your bladder after sex. ? Wipe from front to back after pooping if you are a female. Use each tissue one time when you wipe.  Drink enough fluid to keep your pee pale yellow.  Keep all follow-up visits as told by your doctor. This is important. Contact a doctor if:  You do not get better after 1-2 days.  Your symptoms go away and then come back. Get help right away if:  You have very bad back pain.  You have very bad pain in your lower belly.  You have a fever.  You are sick to your stomach (nauseous).  You are throwing up. Summary  A urinary tract infection (UTI) is an infection of any part of the urinary tract.  This condition is caused by germs in your genital area.  There are many risk factors for a UTI. These include having a small, thin  tube to drain pee and not being able to control when you pee or poop.  Treatment includes antibiotic medicines for germs.  Drink enough fluid to keep your pee pale yellow. This information is not intended to replace advice given to you by your health care provider. Make sure you discuss any questions you have with your health care provider. Document Revised: 07/10/2018 Document Reviewed: 01/30/2018 Elsevier Patient Education  2020 [employer].

## 2019-12-03 NOTE — Progress Notes (Signed)
Virtual telephone visit    Virtual Visit via Telephone Note   This visit type was conducted due to national recommendations for restrictions regarding the COVID-19 Pandemic (e.g. social distancing) in an effort to limit this patient's exposure and mitigate transmission in our community. Due to her co-morbid illnesses, this patient is at least at moderate risk for complications without adequate follow up. This format is felt to be most appropriate for this patient at this time. The patient did not have access to video technology or had technical difficulties with video requiring transitioning to audio format only (telephone). Physical exam was limited to content and character of the telephone converstion.    Patient location: Home Provider location: [facility]   Patient: Vicki Reese   DOB: 06/13/1971   49 y.o. Female  MRN: [id number] Visit Date: 12/03/2019  Today's healthcare provider: Mar Daring, PA-C   Chief Complaint  Patient presents with  . Dysuria   Subjective    Dysuria  This is a new problem. The current episode started in the past 7 days (Sunday and Monday). The problem occurs every urination. The problem has been gradually worsening. The quality of the pain is described as burning and aching (bladder cramping). There has been no fever. Pertinent negatives include no discharge, frequency, hematuria, urgency or vomiting. Associated symptoms comments: More burning and the pressure in her bladder. She has tried increased fluids (AZO cranberry) for the symptoms. Her past medical history is significant for recurrent UTIs.     Patient Active Problem List   Diagnosis Date Noted  . Family history of breast cancer in mother 01/09/2018  . Breast nodule 01/09/2018  . 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 Status post laparoscopic assisted vaginal hysterectomy (LAVH) 06/18/2016  . Chronic pelvic pain in female 05/01/2016  . Body mass index (BMI) of 30.0-30.9 in adult 11/24/2015   . Obesity 11/24/2015  . Migraine with aura and without status migrainosus, not intractable 11/24/2015  . Menstrual migraine 11/07/2015  . Cannot sleep 09/28/2015  . Pre-diabetes 03/21/2015  . Fatigue 03/21/2015  . Endometriosis determined by laparoscopy 01/19/2015  . Depression 01/17/2015   Past Medical History:  Diagnosis Date  . Acid reflux   . Anxiety   . Menstrual migraine 11/07/2015   No Known Allergies    Medications: Outpatient Medications Prior to Visit  Medication Sig  . buPROPion (WELLBUTRIN XL) 300 MG 24 hr tablet TAKE 1 TABLET BY MOUTH EVERY DAY  . ibuprofen (ADVIL,MOTRIN) 200 MG tablet Take 800 mg by mouth every 6 (six) hours as needed for mild pain.  . pantoprazole (PROTONIX) 40 MG tablet TAKE 1 TABLET BY MOUTH EVERY DAY  . cephALEXin (KEFLEX) 500 MG capsule Take 1 capsule (500 mg total) by mouth 4 (four) times daily.   No facility-administered medications prior to visit.    Review of Systems  Gastrointestinal: Negative for vomiting.  Genitourinary: Positive for dysuria. Negative for frequency, hematuria and urgency.    Last CBC Lab Results  Component Value Date   WBC 13.0 (H) 06/15/2016   HGB 12.3 06/19/2016   HCT 40.3 06/15/2016   MCV 90.4 06/15/2016   MCH 31.4 06/15/2016   RDW 12.3 06/15/2016   PLT 272 93/79/0240   Last metabolic panel Lab Results  Component Value Date   GLUCOSE 97 01/06/2014   NA 133 (L) 01/06/2014   K 3.4 (L) 01/06/2014   CL 100 01/06/2014   CO2 28 01/06/2014   BUN 7 01/06/2014   CREATININE 0.76 01/06/2014  GFRNONAA >60 01/06/2014   GFRAA >60 01/06/2014   CALCIUM 8.8 01/06/2014   PROT 8.6 (H) 01/06/2014   ALBUMIN 3.7 01/06/2014   BILITOT 0.4 01/06/2014   ALKPHOS 51 01/06/2014   AST 19 01/06/2014   ALT 15 01/06/2014   ANIONGAP 5 (L) 01/06/2014      Objective    LMP 06/06/2016 (Approximate)  BP Readings from Last 3 Encounters:  09/10/19 128/83  05/06/19 118/82  02/11/18 106/66   Wt Readings from Last 3  Encounters:  09/10/19 154 lb 9.6 oz (70.1 kg)  05/06/19 151 lb (68.5 kg)  02/11/18 151 lb 14.4 oz (68.9 kg)        Assessment & Plan     1. Dysuria Will treat with Keflex as below for suspected UTI. Call if not improving.  - cephALEXin (KEFLEX) 500 MG capsule; Take 1 capsule (500 mg total) by mouth 4 (four) times daily.  Dispense: 14 capsule; Refill: 0  2. Recurrent UTI Patient gets about 4-6 UTIs per year. Has never done prophylactic treatments. Will start Macrobid as below once Keflex completed.  - nitrofurantoin, macrocrystal-monohydrate, (MACROBID) 100 MG capsule; Take 1 capsule (100 mg total) by mouth at bedtime.  Dispense: 90 capsule; Refill: 1   No follow-ups on file.    I discussed the assessment and treatment plan with the patient. The patient was provided an opportunity to ask questions and all were answered. The patient agreed with the plan and demonstrated an understanding of the instructions.   The patient was advised to call back or seek an in-person evaluation if the symptoms worsen or if the condition fails to improve as anticipated.  I provided 8 minutes of non-face-to-face time during this encounter.  Reynolds Bowl, PA-C, have reviewed all documentation for this visit. The documentation on 12/03/19 for the exam, diagnosis, procedures, and orders are all accurate and complete.  [place] [facility] [phone] (phone) [phone] (fax)  [place]

## 2020-02-10 ENCOUNTER — Other Ambulatory Visit: Payer: Self-pay | Admitting: Physician Assistant

## 2020-02-10 NOTE — Telephone Encounter (Signed)
Requested Prescriptions  Pending Prescriptions Disp Refills   buPROPion (WELLBUTRIN XL) 300 MG 24 hr tablet [Pharmacy Med Name: BUPROPION HCL XL 300 MG TABLET] 90 tablet 1    Sig: TAKE 1 TABLET BY MOUTH EVERY DAY     Psychiatry: Antidepressants - bupropion Failed - 02/10/2020  1:32 AM      Failed - Completed PHQ-2 or PHQ-9 in the last 360 days.      Passed - Last BP in normal range    BP Readings from Last 1 Encounters:  09/10/19 128/83         Passed - Valid encounter within last 6 months    Recent Outpatient Visits          2 months ago Recurrent UTI   [place], Clearnce Sorrel, [place]   5 months ago [place], Jennifer M, [place]   9 months ago [place], [place], [place]   3 years ago Abnormal menstruation   [place], [place]   4 years ago Acute cystitis without hematuria   [place], [place]             Patient needs to complete PHQ-9 with next office visit.

## 2020-03-31 ENCOUNTER — Other Ambulatory Visit: Payer: Self-pay

## 2020-03-31 ENCOUNTER — Ambulatory Visit (INDEPENDENT_AMBULATORY_CARE_PROVIDER_SITE_OTHER): Payer: 59 | Admitting: Physician Assistant

## 2020-03-31 ENCOUNTER — Other Ambulatory Visit (HOSPITAL_COMMUNITY)
Admission: RE | Admit: 2020-03-31 | Discharge: 2020-03-31 | Disposition: A | Discharge status: Home / Self Care | Payer: Self-pay | Source: Ambulatory Visit | Attending: Physician Assistant | Admitting: Physician Assistant

## 2020-03-31 ENCOUNTER — Encounter: Payer: Self-pay | Admitting: Physician Assistant

## 2020-03-31 VITALS — BP 111/73 | HR 80 | Temp 99.2°F | Ht 63.5 in | Wt 149.0 lb

## 2020-03-31 DIAGNOSIS — Z1272 Encounter for screening for malignant neoplasm of vagina: Secondary | ICD-10-CM | POA: Insufficient documentation

## 2020-03-31 DIAGNOSIS — Z0000 Encounter for general adult medical examination without abnormal findings: Secondary | ICD-10-CM | POA: Diagnosis not present

## 2020-03-31 DIAGNOSIS — N951 Menopausal and female climacteric states: Secondary | ICD-10-CM | POA: Diagnosis not present

## 2020-03-31 DIAGNOSIS — Z1239 Encounter for other screening for malignant neoplasm of breast: Secondary | ICD-10-CM

## 2020-03-31 DIAGNOSIS — Z1159 Encounter for screening for other viral diseases: Secondary | ICD-10-CM

## 2020-03-31 DIAGNOSIS — Z803 Family history of malignant neoplasm of breast: Secondary | ICD-10-CM | POA: Diagnosis not present

## 2020-03-31 DIAGNOSIS — Z01419 Encounter for gynecological examination (general) (routine) without abnormal findings: Secondary | ICD-10-CM

## 2020-03-31 DIAGNOSIS — Z87898 Personal history of other specified conditions: Secondary | ICD-10-CM

## 2020-03-31 DIAGNOSIS — K219 Gastro-esophageal reflux disease without esophagitis: Secondary | ICD-10-CM | POA: Diagnosis not present

## 2020-03-31 DIAGNOSIS — Z114 Encounter for screening for human immunodeficiency virus [HIV]: Secondary | ICD-10-CM

## 2020-03-31 MED ORDER — PANTOPRAZOLE SODIUM 40 MG PO TBEC: 40.0000 mg | DELAYED_RELEASE_TABLET | Freq: Two times a day (BID) | ORAL | 1 refills | Status: DC

## 2020-03-31 NOTE — Patient Instructions (Addendum)
Black cohosh- menopausal symptoms try for one month and call if not improving   [facility] [place],  [place]  [address] [address]: [phone]   Health Maintenance, Female Adopting a healthy lifestyle and getting preventive care are important in promoting health and wellness. Ask your health care provider about:  The right schedule for you to have regular tests and exams.  Things you can do on your own to prevent diseases and keep yourself healthy. What should I know about diet, weight, and exercise? Eat a healthy diet   Eat a diet that includes plenty of vegetables, fruits, low-fat dairy products, and lean protein.  Do not eat a lot of foods that are high in solid fats, added sugars, or sodium. Maintain a healthy weight Body mass index (BMI) is used to identify weight problems. It estimates body fat based on height and weight. Your health care provider can help determine your BMI and help you achieve or maintain a healthy weight. Get regular exercise Get regular exercise. This is one of the most important things you can do for your health. Most adults should:  Exercise for at least 150 minutes each week. The exercise should increase your heart rate and make you sweat (moderate-intensity exercise).  Do strengthening exercises at least twice a week. This is in addition to the moderate-intensity exercise.  Spend less time sitting. Even light physical activity can be beneficial. Watch cholesterol and blood lipids Have your blood tested for lipids and cholesterol at 48 years of age, then have this test every 5 years. Have your cholesterol levels checked more often if:  Your lipid or cholesterol levels are high.  You are older than 47 years of age.  You are at high risk for heart disease. What should I know about cancer screening? Depending on your health history and family history, you may need to have cancer screening at various  ages. This may include screening for:  Breast cancer.  Cervical cancer.  Colorectal cancer.  Skin cancer.  Lung cancer. What should I know about heart disease, diabetes, and high blood pressure? Blood pressure and heart disease  High blood pressure causes heart disease and increases the risk of stroke. This is more likely to develop in people who have high blood pressure readings, are of African descent, or are overweight.  Have your blood pressure checked: ? Every 3-5 years if you are 89-20 years of age. ? Every year if you are 43 years old or older. Diabetes Have regular diabetes screenings. This checks your fasting blood sugar level. Have the screening done:  Once every three years after age 67 if you are at a normal weight and have a low risk for diabetes.  More often and at a younger age if you are overweight or have a high risk for diabetes. What should I know about preventing infection? Hepatitis B If you have a higher risk for hepatitis B, you should be screened for this virus. Talk with your health care provider to find out if you are at risk for hepatitis B infection. Hepatitis C Testing is recommended for:  Everyone born from [birth year] through 1963.  Anyone with known risk factors for hepatitis C. Sexually transmitted infections (STIs)  Get screened for STIs, including gonorrhea and chlamydia, if: ? You are sexually active and are younger than 47 years of age. ? You are older than 47 years of age and your health care provider tells you that you  are at risk for this type of infection. ? Your sexual activity has changed since you were last screened, and you are at increased risk for chlamydia or gonorrhea. Ask your health care provider if you are at risk.  Ask your health care provider about whether you are at high risk for HIV. Your health care provider may recommend a prescription medicine to help prevent HIV infection. If you choose to take medicine to prevent HIV, you  should first get tested for HIV. You should then be tested every 3 months for as long as you are taking the medicine. Pregnancy  If you are about to stop having your period (premenopausal) and you may become pregnant, seek counseling before you get pregnant.  Take 400 to 800 micrograms (mcg) of folic acid every day if you become pregnant.  Ask for birth control (contraception) if you want to prevent pregnancy. Osteoporosis and menopause Osteoporosis is a disease in which the bones lose minerals and strength with aging. This can result in bone fractures. If you are 47 years old or older, or if you are at risk for osteoporosis and fractures, ask your health care provider if you should:  Be screened for bone loss.  Take a calcium or vitamin D supplement to lower your risk of fractures.  Be given hormone replacement therapy (HRT) to treat symptoms of menopause. Follow these instructions at home: Lifestyle  Do not use any products that contain nicotine or tobacco, such as cigarettes, e-cigarettes, and chewing tobacco. If you need help quitting, ask your health care provider.  Do not use street drugs.  Do not share needles.  Ask your health care provider for help if you need support or information about quitting drugs. Alcohol use  Do not drink alcohol if: ? Your health care provider tells you not to drink. ? You are pregnant, may be pregnant, or are planning to become pregnant.  If you drink alcohol: ? Limit how much you use to 0-1 drink a day. ? Limit intake if you are breastfeeding.  Be aware of how much alcohol is in your drink. In the [place] one drink equals one 12 oz bottle of beer (355 mL), one 5 oz glass of wine (148 mL), or one 1 oz glass of hard liquor (44 mL). General instructions  Schedule regular health, dental, and eye exams.  Stay current with your vaccines.  Tell your health care provider if: ? You often feel depressed. ? You have ever been abused or do not feel  safe at home. Summary  Adopting a healthy lifestyle and getting preventive care are important in promoting health and wellness.  Follow your health care provider's instructions about healthy diet, exercising, and getting tested or screened for diseases.  Follow your health care provider's instructions on monitoring your cholesterol and blood pressure. This information is not intended to replace advice given to you by your health care provider. Make sure you discuss any questions you have with your health care provider. Document Revised: 07/16/2018 Document Reviewed: 07/16/2018 Elsevier Patient Education  2020 [employer].

## 2020-03-31 NOTE — Progress Notes (Signed)
Complete physical exam   Patient: Vicki Reese   DOB: 18-Jul-1971   49 y.o. Female  MRN: [id number] Visit Date: 03/31/2020  Today's healthcare provider: Mar Daring, PA-C   Chief Complaint  Patient presents with  . Annual Exam  . Gastroesophageal Reflux  . Hot Flashes   Subjective    Vicki Reese is a 50 y.o. female who presents today for a complete physical exam.  She reports consuming a general diet. The patient does not participate in regular exercise at present. She generally feels well. She reports sleeping well. She does have additional problems to discuss today.   Patient reports that she is starting to have severe hot flashes. She reports that it has worsened over the last month.   She also mentions that her GERD symptoms have worsened. She reports that her symptoms are worse at bedtime. She is currently taking pantoprazole 54m daily. Initially, this helped her symptoms, but she feels it may be time to change the medication.   Past Medical History:  Diagnosis Date  . Acid reflux   . Anxiety   . Menstrual migraine 11/07/2015   Past Surgical History:  Procedure Laterality Date  . ABDOMINAL HYSTERECTOMY    . BREAST BIOPSY Right 01/28/2018   benign  . DIAGNOSTIC LAPAROSCOPY    . DILATION AND CURETTAGE OF UTERUS    . LAPAROSCOPIC ASSISTED VAGINAL HYSTERECTOMY N/A 06/18/2016   Procedure: LAPAROSCOPIC ASSISTED VAGINAL HYSTERECTOMY WITH BILATERAL SALPINGECTOMY;  Surgeon: MBrayton Mars MD;  Location: [facility] ORS;  Service: Gynecology;  Laterality: N/A;  . laparoscopy     Social History   Socioeconomic History  . Marital status: Divorced    Spouse name: Not on file  . Number of children: Not on file  . Years of education: Not on file  . Highest education level: Not on file  Occupational History  . Not on file  Tobacco Use  . Smoking status: Former Smoker    Quit date: 08/06/2006    Years since quitting: 13.6  . Smokeless tobacco: Never  Used  . Tobacco comment: QUIT IN 2008  Vaping Use  . Vaping Use: Never used  Substance and Sexual Activity  . Alcohol use: Yes    Alcohol/week: 0.0 standard drinks    Comment: OCCASIONALLY  . Drug use: No  . Sexual activity: Yes    Birth control/protection: Surgical  Other Topics Concern  . Not on file  Social History Narrative  . Not on file   Social Determinants of Health   Financial Resource Strain:   . Difficulty of Paying Living Expenses: Not on file  Food Insecurity:   . Worried About RCharity fundraiserin the Last Year: Not on file  . Ran Out of Food in the Last Year: Not on file  Transportation Needs:   . Lack of Transportation (Medical): Not on file  . Lack of Transportation (Non-Medical): Not on file  Physical Activity:   . Days of Exercise per Week: Not on file  . Minutes of Exercise per Session: Not on file  Stress:   . Feeling of Stress : Not on file  Social Connections:   . Frequency of Communication with Friends and Family: Not on file  . Frequency of Social Gatherings with Friends and Family: Not on file  . Attends Religious Services: Not on file  . Active Member of Clubs or Organizations: Not on file  . Attends CArchivistMeetings: Not on file  .  Marital Status: Not on file  Intimate Partner Violence:   . Fear of Current or Ex-Partner: Not on file  . Emotionally Abused: Not on file  . Physically Abused: Not on file  . Sexually Abused: Not on file   Family Status  Relation Name Status  . Mother  Alive  . Father  Alive  . Brother adopted [employer]  . PGM  (Not Specified)  . Neg Hx  (Not Specified)   Family History  Problem Relation Age of Onset  . Breast cancer Mother 68  . Lupus Father   . Diabetes Father   . Stroke Father   . Heart disease Paternal Grandmother   . Ovarian cancer Neg Hx   . Colon cancer Neg Hx    No Known Allergies  Patient Care Team: Rubye Beach as PCP - General (Physician Assistant)    Medications: Outpatient Medications Prior to Visit  Medication Sig  . buPROPion (WELLBUTRIN XL) 300 MG 24 hr tablet TAKE 1 TABLET BY MOUTH EVERY DAY  . ibuprofen (ADVIL,MOTRIN) 200 MG tablet Take 800 mg by mouth every 6 (six) hours as needed for mild pain.  . nitrofurantoin, macrocrystal-monohydrate, (MACROBID) 100 MG capsule Take 1 capsule (100 mg total) by mouth at bedtime.  . [DISCONTINUED] cephALEXin (KEFLEX) 500 MG capsule Take 1 capsule (500 mg total) by mouth 4 (four) times daily.  . [DISCONTINUED] pantoprazole (PROTONIX) 40 MG tablet TAKE 1 TABLET BY MOUTH EVERY DAY   No facility-administered medications prior to visit.    Review of Systems  Constitutional: Positive for diaphoresis.  HENT: Negative.   Eyes: Negative.   Respiratory: Negative.   Cardiovascular: Negative.   Gastrointestinal: Negative.        Gerd  Endocrine: Negative.   Genitourinary: Negative.   Musculoskeletal: Negative.   Skin: Negative.   Allergic/Immunologic: Negative.   Neurological: Negative.   Hematological: Negative.   Psychiatric/Behavioral: Negative.     Last CBC Lab Results  Component Value Date   WBC 13.0 (H) 06/15/2016   HGB 12.3 06/19/2016   HCT 40.3 06/15/2016   MCV 90.4 06/15/2016   MCH 31.4 06/15/2016   RDW 12.3 06/15/2016   PLT 272 29/93/7169   Last metabolic panel Lab Results  Component Value Date   GLUCOSE 97 01/06/2014   NA 133 (L) 01/06/2014   K 3.4 (L) 01/06/2014   CL 100 01/06/2014   CO2 28 01/06/2014   BUN 7 01/06/2014   CREATININE 0.76 01/06/2014   GFRNONAA >60 01/06/2014   GFRAA >60 01/06/2014   CALCIUM 8.8 01/06/2014   PROT 8.6 (H) 01/06/2014   ALBUMIN 3.7 01/06/2014   BILITOT 0.4 01/06/2014   ALKPHOS 51 01/06/2014   AST 19 01/06/2014   ALT 15 01/06/2014   ANIONGAP 5 (L) 01/06/2014      Objective    BP 111/73 (BP Location: Right Arm)   Pulse 80   Temp 99.2 F (37.3 C)   Ht 5' 3.5" (1.613 m)   Wt 149 lb (67.6 kg)   LMP 06/06/2016 (Approximate)    BMI 25.98 kg/m  BP Readings from Last 3 Encounters:  03/31/20 111/73  09/10/19 128/83  05/06/19 118/82   Wt Readings from Last 3 Encounters:  03/31/20 149 lb (67.6 kg)  09/10/19 154 lb 9.6 oz (70.1 kg)  05/06/19 151 lb (68.5 kg)      Physical Exam Vitals reviewed.  Constitutional:      General: She is not in acute distress.    Appearance: Normal appearance.  She is well-developed and normal weight. She is not ill-appearing or diaphoretic.  HENT:     Head: Normocephalic and atraumatic.     Right Ear: Hearing, tympanic membrane, ear canal and external ear normal.     Left Ear: Hearing, tympanic membrane, ear canal and external ear normal.     Nose: Nose normal.     Mouth/Throat:     Mouth: Mucous membranes are moist.     Pharynx: Oropharynx is clear. Uvula midline. No oropharyngeal exudate.  Eyes:     General: No scleral icterus.       Right eye: No discharge.        Left eye: No discharge.     Extraocular Movements: Extraocular movements intact.     Conjunctiva/sclera: Conjunctivae normal.     Pupils: Pupils are equal, round, and reactive to light.  Neck:     Thyroid: No thyromegaly.     Vascular: No carotid bruit or JVD.     Trachea: No tracheal deviation.  Cardiovascular:     Rate and Rhythm: Normal rate and regular rhythm.     Pulses: Normal pulses.     Heart sounds: Normal heart sounds. No murmur heard.  No friction rub. No gallop.   Pulmonary:     Effort: Pulmonary effort is normal. No respiratory distress.     Breath sounds: Normal breath sounds. No wheezing or rales.  Chest:     Chest wall: No tenderness.     Breasts: Breasts are symmetrical.        Right: No inverted nipple, mass, nipple discharge, skin change or tenderness.        Left: No inverted nipple, mass, nipple discharge, skin change or tenderness.  Abdominal:     General: Bowel sounds are normal. There is no distension.     Palpations: Abdomen is soft. There is no mass.     Tenderness: There is  no abdominal tenderness. There is no guarding or rebound.     Hernia: There is no hernia in the left inguinal area.  Genitourinary:    General: Normal vulva.     Exam position: Supine.     Labia:        Right: No rash, tenderness, lesion or injury.        Left: No rash, tenderness, lesion or injury.      Vagina: Normal. No signs of injury. No vaginal discharge, erythema, tenderness or bleeding.     Uterus: Absent.      Adnexa:        Right: No mass, tenderness or fullness.         Left: No mass, tenderness or fullness.       Rectum: Normal.  Musculoskeletal:        General: No tenderness. Normal range of motion.     Cervical back: Normal range of motion and neck supple.     Right lower leg: No edema.     Left lower leg: No edema.  Lymphadenopathy:     Cervical: No cervical adenopathy.  Skin:    General: Skin is warm and dry.     Capillary Refill: Capillary refill takes less than 2 seconds.     Findings: No rash.  Neurological:     General: No focal deficit present.     Mental Status: She is alert and oriented to person, place, and time. Mental status is at baseline.     Cranial Nerves: No cranial nerve deficit.     Coordination:  Coordination normal.     Deep Tendon Reflexes: Reflexes are normal and symmetric.  Psychiatric:        Mood and Affect: Mood normal.        Behavior: Behavior normal.        Thought Content: Thought content normal.        Judgment: Judgment normal.     Last depression screening scores PHQ 2/9 Scores 03/31/2020  PHQ - 2 Score 0   Last fall risk screening Fall Risk  09/10/2019  Number falls in past yr: 0  Injury with Fall? 0  Follow up Falls evaluation completed   Last Audit-C alcohol use screening Alcohol Use Disorder Test (AUDIT) 03/31/2020  1. How often do you have a drink containing alcohol? 2  2. How many drinks containing alcohol do you have on a typical day when you are drinking? 0  3. How often do you have six or more drinks on one  occasion? 0  AUDIT-C Score 2  Alcohol Brief Interventions/Follow-up AUDIT Score <7 follow-up not indicated   A score of 3 or more in women, and 4 or more in men indicates increased risk for alcohol abuse, EXCEPT if all of the points are from question 1   No results found for any visits on 03/31/20.  Assessment & Plan    Routine Health Maintenance and Physical Exam  Exercise Activities and Dietary recommendations Goals    . Exercise 150 minutes per week (moderate activity)        There is no immunization history on file for this patient.  Health Maintenance  Topic Date Due  . Hepatitis C Screening  Never done  . COVID-19 Vaccine (1) Never done  . HIV Screening  Never done  . TETANUS/TDAP  Never done  . PAP SMEAR-Modifier  12/06/2019  . INFLUENZA VACCINE  11/03/2020 (Originally 03/06/2020)    Discussed health benefits of physical activity, and encouraged her to engage in regular exercise appropriate for her age and condition.  1. Annual physical exam Normal physical exam today. Will check labs as below and f/u pending lab results. If labs are stable and WNL she will not need to have these rechecked for one year at her next annual physical exam. She is to call the office in the meantime if she has any acute issue, questions or concerns. - CBC w/Diff/Platelet - Comprehensive Metabolic Panel (CMET) - TSH - Lipid Panel With LDL/HDL Ratio  2. Gastroesophageal reflux disease Worsening. Will increase pantoprazole to BID dosing from once daily dosing. If not improving, please call the office and will change therapy to dexilant.  - pantoprazole (PROTONIX) 40 MG tablet; Take 1 tablet (40 mg total) by mouth 2 (two) times daily before a meal.  Dispense: 180 tablet; Refill: 1  3. Encounter for hepatitis C screening test for low risk patient Will check labs as below and f/u pending results. - Hepatitis C Antibody  4. Screening for HIV without presence of risk factors Will check labs as  below and f/u pending results. - HIV antibody (with reflex)  5. Family history of breast cancer in mother Mother has history of breast cancer and patient has had abnormal mammograms in the past. Needs annual f/u. Ordered as below.  - MM 3D SCREEN BREAST BILATERAL; Future  6. Encounter for breast cancer screening using non-mammogram modality See above medical treatment plan. - MM 3D SCREEN BREAST BILATERAL; Future  7. History of abnormal mammogram See above medical treatment plan. - MM 3D SCREEN  BREAST BILATERAL; Future  8. Encounter for Papanicolaou smear of vagina as part of routine gynecological examination Pap is normal.  Will repeat in 3 years. - Cytology - PAP  9. Hot flash, menopausal Discussed non-hormonal and hormonal treatments. Will try Black cohosh first. Call if not helping.    Return in about 1 year (around 03/31/2021).     Reynolds Bowl, PA-C, have reviewed all documentation for this visit. The documentation on 04/01/20 for the exam, diagnosis, procedures, and orders are all accurate and complete.   Rubye Beach  [facility] [phone] (phone) [phone] (fax)  [place]

## 2020-04-05 ENCOUNTER — Telehealth: Payer: Self-pay

## 2020-04-05 LAB — LIPID PANEL WITH LDL/HDL RATIO
Cholesterol, Total: 174 mg/dL (ref 100–199)
HDL: 72 mg/dL (ref >39)
LDL Chol Calc (NIH): 91 mg/dL (ref 0–99)
LDL/HDL Ratio: 1.3 ratio (ref 0.0–3.2)
Triglycerides: 57 mg/dL (ref 0–149)
VLDL Cholesterol Cal: 11 mg/dL (ref 5–40)

## 2020-04-05 LAB — CBC WITH DIFFERENTIAL/PLATELET
Basophils Absolute: 0 10*3/uL (ref 0.0–0.2)
Basos: 0 %
EOS (ABSOLUTE): 0 10*3/uL (ref 0.0–0.4)
Eos: 1 %
Hematocrit: 35.4 % (ref 34.0–46.6)
Hemoglobin: 11.7 g/dL (ref 11.1–15.9)
Immature Grans (Abs): 0 10*3/uL (ref 0.0–0.1)
Immature Granulocytes: 0 %
Lymphocytes Absolute: 2 10*3/uL (ref 0.7–3.1)
Lymphs: 39 %
MCH: 31.7 pg (ref 26.6–33.0)
MCHC: 33.1 g/dL (ref 31.5–35.7)
MCV: 96 fL (ref 79–97)
Monocytes Absolute: 0.4 10*3/uL (ref 0.1–0.9)
Monocytes: 8 %
Neutrophils Absolute: 2.7 10*3/uL (ref 1.4–7.0)
Neutrophils: 52 %
Platelets: 262 10*3/uL (ref 150–450)
RBC: 3.69 x10E6/uL — ABNORMAL LOW (ref 3.77–5.28)
RDW: 11.6 % — ABNORMAL LOW (ref 11.7–15.4)
WBC: 5.1 10*3/uL (ref 3.4–10.8)

## 2020-04-05 LAB — COMPREHENSIVE METABOLIC PANEL
ALT: 12 IU/L (ref 0–32)
AST: 12 IU/L (ref 0–40)
Albumin/Globulin Ratio: 1.5 (ref 1.2–2.2)
Albumin: 4.2 g/dL (ref 3.8–4.8)
Alkaline Phosphatase: 54 IU/L (ref 48–121)
BUN/Creatinine Ratio: 18 (ref 9–23)
BUN: 14 mg/dL (ref 6–24)
Bilirubin Total: 0.2 mg/dL (ref 0.0–1.2)
CO2: 23 mmol/L (ref 20–29)
Calcium: 9.2 mg/dL (ref 8.7–10.2)
Chloride: 102 mmol/L (ref 96–106)
Creatinine, Ser: 0.78 mg/dL (ref 0.57–1.00)
GFR calc Af Amer: 105 mL/min/{1.73_m2} (ref >59)
GFR calc non Af Amer: 91 mL/min/{1.73_m2} (ref >59)
Globulin, Total: 2.8 g/dL (ref 1.5–4.5)
Glucose: 125 mg/dL — ABNORMAL HIGH (ref 65–99)
Potassium: 4.2 mmol/L (ref 3.5–5.2)
Sodium: 139 mmol/L (ref 134–144)
Total Protein: 7 g/dL (ref 6.0–8.5)

## 2020-04-05 LAB — TSH: TSH: 1.02 u[IU]/mL (ref 0.450–4.500)

## 2020-04-05 LAB — HIV ANTIBODY (ROUTINE TESTING W REFLEX): HIV Screen 4th Generation wRfx: NONREACTIVE

## 2020-04-05 LAB — HEPATITIS C ANTIBODY: Hep C Virus Ab: <0.1 s/co ratio (ref 0.0–0.9)

## 2020-04-05 NOTE — Telephone Encounter (Signed)
-----   Message from Mar Daring, [place] sent at 04/05/2020  9:31 AM EDT ----- Blood count is normal. Kidney and liver function are normal. Sodium, potassium, and calcium are normal. Thyroid is normal. Cholesterol is normal. Hepatitis c screen is negative. HIV screen is negative. Pap is still pending.

## 2020-04-05 NOTE — Telephone Encounter (Signed)
Patient advised as directed below.

## 2020-04-08 ENCOUNTER — Telehealth: Payer: Self-pay

## 2020-04-08 ENCOUNTER — Encounter: Payer: Self-pay | Admitting: Physician Assistant

## 2020-04-08 DIAGNOSIS — R87610 Atypical squamous cells of undetermined significance on cytologic smear of cervix (ASC-US): Secondary | ICD-10-CM

## 2020-04-08 LAB — CYTOLOGY - PAP
Comment: NEGATIVE
Comment: NEGATIVE
Diagnosis: UNDETERMINED — AB
HPV 16: NEGATIVE
HPV 18 / 45: NEGATIVE
High risk HPV: POSITIVE — AB

## 2020-04-08 NOTE — Telephone Encounter (Signed)
Seen by patient Vicki Reese on 04/08/2020 6:22 PM

## 2020-04-08 NOTE — Telephone Encounter (Signed)
-----   Message from Mar Daring, [place] sent at 04/08/2020  6:19 PM EDT ----- Pap smear is abnormal with atypical cells of undetermined significance with high risk HPV positive. I would recommend a referral to GYN for consideration of a colposcopy (special test where they look at the cervix with special light for abnormal cells). If agreeable I would place referral for you.

## 2020-04-18 ENCOUNTER — Encounter: Payer: Self-pay | Admitting: Physician Assistant

## 2020-04-18 DIAGNOSIS — K219 Gastro-esophageal reflux disease without esophagitis: Secondary | ICD-10-CM

## 2020-04-18 MED ORDER — PANTOPRAZOLE SODIUM 40 MG PO TBEC: 40.0000 mg | DELAYED_RELEASE_TABLET | Freq: Two times a day (BID) | ORAL | 1 refills | Status: DC

## 2020-04-22 ENCOUNTER — Ambulatory Visit (INDEPENDENT_AMBULATORY_CARE_PROVIDER_SITE_OTHER): Payer: 59 | Admitting: Obstetrics and Gynecology

## 2020-04-22 ENCOUNTER — Other Ambulatory Visit: Payer: Self-pay

## 2020-04-22 ENCOUNTER — Encounter: Payer: Self-pay | Admitting: Obstetrics and Gynecology

## 2020-04-22 VITALS — BP 104/67 | HR 77 | Ht 63.5 in | Wt 149.4 lb

## 2020-04-22 DIAGNOSIS — B977 Papillomavirus as the cause of diseases classified elsewhere: Secondary | ICD-10-CM

## 2020-04-22 DIAGNOSIS — N951 Menopausal and female climacteric states: Secondary | ICD-10-CM | POA: Diagnosis not present

## 2020-04-22 DIAGNOSIS — R87610 Atypical squamous cells of undetermined significance on cytologic smear of cervix (ASC-US): Secondary | ICD-10-CM | POA: Diagnosis not present

## 2020-04-22 DIAGNOSIS — N72 Inflammatory disease of cervix uteri: Secondary | ICD-10-CM

## 2020-04-22 NOTE — Progress Notes (Signed)
HPI:  Vicki Reese is a 49 y.o.  [id number]  who presents today for evaluation and management of abnormal cytology.    Dysplasia History: Patient has a distant history of previous colposcopy and biopsy.  She states that this biopsy 23 years ago was normal.  No further follow-up.  She has had Pap smears since that time. Her most recent Pap smear reveals ASCUS positive HPV (not 16 or 18) Of significant note patient has previously had a hysterectomy for endometriosis-not related to any cervical dysplasia issue. Not entirely sure why patient had a Pap of the vaginal cuff performed. Patient has also begun to have symptoms of menopause with hot flashes, mood changes, and return of "menstrual migraine type headaches." Of significant note patient's mother has had a history of breast cancer.  ROS:  Pertinent items noted in HPI and remainder of comprehensive ROS otherwise negative.  OB History  Gravida Para Term Preterm AB Living  3 2 2   1 2   SAB TAB Ectopic Multiple Live Births  1       2    # Outcome Date GA Lbr Len/2nd Weight Sex Delivery Anes PTL Lv  3 Term 2007   10 lb (4.536 kg) M Vag-Spont   LIV  2 SAB 2006          1 Term 1998   8 lb 1.9 oz (3.683 kg) M Vag-Spont   LIV    Past Medical History:  Diagnosis Date  . Acid reflux   . Anxiety   . Menstrual migraine 11/07/2015    Past Surgical History:  Procedure Laterality Date  . ABDOMINAL HYSTERECTOMY    . BREAST BIOPSY Right 01/28/2018   benign  . DIAGNOSTIC LAPAROSCOPY    . DILATION AND CURETTAGE OF UTERUS    . LAPAROSCOPIC ASSISTED VAGINAL HYSTERECTOMY N/A 06/18/2016   Procedure: LAPAROSCOPIC ASSISTED VAGINAL HYSTERECTOMY WITH BILATERAL SALPINGECTOMY;  Surgeon: Brayton Mars, MD;  Location: [facility] ORS;  Service: Gynecology;  Laterality: N/A;  . laparoscopy      SOCIAL HISTORY: Social History   Substance and Sexual Activity  Alcohol Use Yes  . Alcohol/week: 0.0 standard drinks   Comment: OCCASIONALLY   Social  History   Substance and Sexual Activity  Drug Use No     Family History  Problem Relation Age of Onset  . Breast cancer Mother 40  . Lupus Father   . Diabetes Father   . Stroke Father   . Heart disease Paternal Grandmother   . Ovarian cancer Neg Hx   . Colon cancer Neg Hx     ALLERGIES:  Patient has no known allergies.  She has a current medication list which includes the following prescription(s): bupropion, ibuprofen, and pantoprazole.  Physical Exam: -Vitals:  BP 104/67   Pulse 77   Ht 5' 3.5" (1.613 m)   Wt 149 lb 6 oz (67.8 kg)   LMP 06/06/2016 (Approximate)   BMI 26.05 kg/m   PROCEDURE: Colposcopy of vaginal cuff and upper vagina performed with 4% acetic acid after verbal consent obtained                           -Aceto-white Lesions Location(s): None o'clock.              -Biopsy performed at none o'clock               -ECC indicated and performed: No.     -Biopsy sites  made hemostatic with pressure and Monsel's solution   -Satisfactory colposcopy: Yes.      -Evidence of Invasive cervical CA :  NO  ASSESSMENT:  Vicki Reese is a 49 y.o. [id number] here for  1. Atypical squamous cells of undetermined significance on cytologic smear of cervix (ASC-US)   2. High risk human papilloma virus (HPV) infection of cervix   3. Symptomatic menopausal or female climacteric states   .  PLAN: 1.  I discussed the grading system of pap smears and HPV high risk viral types.  Her colposcopy being entirely negative with no biopsies performed, I am not convinced that any further follow-up is warranted.  I have offered the patient to have a follow-up Pap smear in 1 year or to simply stop having Pap smears recognizing that her risk of vaginal cancer is very low even with HPV -[id number].  She plans to consider this and inform me at her next visit. 2.  We have discussed the signs and symptoms of menopause, possible FSH testing, possible ERT testing.  Patient has a family history of  breast cancer and we have briefly discussed genetic testing as well.  No orders of the defined types were placed in this encounter.      F/U  Return in about 2 weeks (around 05/06/2020). I spent 21 minutes involved in the care of this patient preparing to see the patient by obtaining and reviewing her medical history (including labs, imaging tests and prior procedures), documenting clinical information in the electronic health record (EHR), counseling and coordinating care plans, writing and sending prescriptions, ordering tests or procedures and directly communicating with the patient by discussing pertinent items from her history and physical exam as well as detailing my assessment and plan as noted above so that she has an informed understanding.  All of her questions were answered.  Jeannie Fend ,MD 04/22/2020,10:45 AM

## 2020-05-04 ENCOUNTER — Telehealth: Payer: Self-pay

## 2020-05-04 ENCOUNTER — Ambulatory Visit (INDEPENDENT_AMBULATORY_CARE_PROVIDER_SITE_OTHER): Payer: 59 | Admitting: Obstetrics and Gynecology

## 2020-05-04 ENCOUNTER — Encounter: Payer: Self-pay | Admitting: Obstetrics and Gynecology

## 2020-05-04 ENCOUNTER — Other Ambulatory Visit: Payer: Self-pay

## 2020-05-04 VITALS — BP 117/83 | HR 70 | Ht 63.5 in | Wt 151.6 lb

## 2020-05-04 DIAGNOSIS — N951 Menopausal and female climacteric states: Secondary | ICD-10-CM

## 2020-05-04 DIAGNOSIS — Z803 Family history of malignant neoplasm of breast: Secondary | ICD-10-CM | POA: Diagnosis not present

## 2020-05-04 MED ORDER — ESTRADIOL 1 MG PO TABS: 1.0000 mg | ORAL_TABLET | Freq: Every day | ORAL | 0 refills | Status: DC

## 2020-05-04 NOTE — Progress Notes (Signed)
HPI:      [name] is a 49 y.o. [id number] who LMP was Patient's last menstrual period was 06/06/2016 (approximate).  Subjective:   She presents today to discuss menopausal symptoms.  Patient states that she has multiple hot flashes per day.  She also complains of occasional "brain fog". She would like to discuss the possibility of ERT. She has previously had a hysterectomy Her mother has a history of breast cancer but has not been genetically tested for BRCA    Hx: The following portions of the patient's history were reviewed and updated as appropriate:             She  has a past medical history of Acid reflux, Anxiety, and Menstrual migraine (11/07/2015). She does not have any pertinent problems on file. She  has a past surgical history that includes Dilation and curettage of uterus; laparoscopy; Diagnostic laparoscopy; Laparoscopic assisted vaginal hysterectomy (N/A, 06/18/2016); Breast biopsy (Right, 01/28/2018); and Abdominal hysterectomy. Her family history includes Breast cancer (age of onset: 61) in her mother; Diabetes in her father; Heart disease in her paternal grandmother; Lupus in her father; Stroke in her father. She  reports that she quit smoking about 13 years ago. She has never used smokeless tobacco. She reports current alcohol use. She reports that she does not use drugs. She has a current medication list which includes the following prescription(s): bupropion, ibuprofen, nitrofurantoin, pantoprazole, and estradiol. She has No Known Allergies.       Review of Systems:  Review of Systems  Constitutional: Denied constitutional symptoms, night sweats, recent illness, fatigue, fever, insomnia and weight loss.  Eyes: Denied eye symptoms, eye pain, photophobia, vision change and visual disturbance.  Ears/Nose/Throat/Neck: Denied ear, nose, throat or neck symptoms, hearing loss, nasal discharge, sinus congestion and sore throat.  Cardiovascular: Denied cardiovascular  symptoms, arrhythmia, chest pain/pressure, edema, exercise intolerance, orthopnea and palpitations.  Respiratory: Denied pulmonary symptoms, asthma, pleuritic pain, productive sputum, cough, dyspnea and wheezing.  Gastrointestinal: Denied, gastro-esophageal reflux, melena, nausea and vomiting.  Genitourinary: Denied genitourinary symptoms including symptomatic vaginal discharge, pelvic relaxation issues, and urinary complaints.  Musculoskeletal: Denied musculoskeletal symptoms, stiffness, swelling, muscle weakness and myalgia.  Dermatologic: Denied dermatology symptoms, rash and scar.  Neurologic: Denied neurology symptoms, dizziness, headache, neck pain and syncope.  Psychiatric: Denied psychiatric symptoms, anxiety and depression.  Endocrine: See HPI for additional information.   Meds:   Current Outpatient Medications on File Prior to Visit  Medication Sig Dispense Refill  . buPROPion (WELLBUTRIN XL) 300 MG 24 hr tablet TAKE 1 TABLET BY MOUTH EVERY DAY 90 tablet 1  . ibuprofen (ADVIL,MOTRIN) 200 MG tablet Take 800 mg by mouth every 6 (six) hours as needed for mild pain.    . nitrofurantoin (MACRODANTIN) 100 MG capsule Take 100 mg by mouth at bedtime.    . pantoprazole (PROTONIX) 40 MG tablet Take 1 tablet (40 mg total) by mouth 2 (two) times daily before a meal. 180 tablet 1   No current facility-administered medications on file prior to visit.    Objective:     Vitals:   05/04/20 1042  BP: 117/83  Pulse: 70                Assessment:    [id number] Patient Active Problem List   Diagnosis Date Noted  . Family history of breast cancer in mother 01/09/2018  . Breast nodule 01/09/2018  . 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 Status post laparoscopic assisted vaginal hysterectomy (LAVH) 06/18/2016  .  Chronic pelvic pain in female 05/01/2016  . Body mass index (BMI) of 30.0-30.9 in adult 11/24/2015  . Obesity 11/24/2015  . Migraine with aura and without status migrainosus, not  intractable 11/24/2015  . Menstrual migraine 11/07/2015  . Cannot sleep 09/28/2015  . Pre-diabetes 03/21/2015  . Fatigue 03/21/2015  . Endometriosis determined by laparoscopy 01/19/2015  . Depression 01/17/2015     1. Symptomatic menopausal or female climacteric states   2. Family history of breast cancer in mother     Patient seems to be symptomatically menopausal or climacteric.  She is a little young for for menopause but because she has had a prior hysterectomy it is more likely.    Plan:            1.  [place]  2.  We have discussed genetic testing again and patient desires genetic testing for inheritable cancer conditions.  She has talked with her insurance company regarding this.  [facility] cancer panel ordered.   3.  HRT I have discussed HRT with the patient in detail.  The risk/benefits of it were reviewed.  She understands that during menopause Estrogen decreases dramatically and that this results in an increased risk of cardiovascular disease as well as osteoporosis.  We have also discussed the fact that hot flashes often result from a decrease in Estrogen, and that by replacing Estrogen, they can often be alleviated.  We have discussed skin, vaginal and urinary tract changes that may also take place from this drop in Estrogen.  Emotional changes have also been linked to Estrogen and we have briefly discussed this.  The benefits of HRT including decrease in hot flashes, vaginal dryness, and osteoporosis were discussed.  The emotional benefit and a possible change in her cardiovascular risk profile was also reviewed.  The risks associated with Hormone Replacement Therapy were also reviewed.  The use of unopposed Estrogen and its relationship to endometrial cancer was discussed.  The addition of Progesterone and its beneficial effect on endometrial cancer was also noted.  The fact that there has been no consistent definitive studies showing an increase in breast cancer in women who use HRT  was discussed with the patient.  The possible side effects including breast tenderness, fluid retention, mood changes and vaginal bleeding were discussed.  The patient was informed that this is an elective medication and that she may choose not to take Hormone Replacement Therapy.  Literature on HRT was made available, and I believe that after answering all of the patient's questions.  Special emphasis on the WHI study, as well as several studies since that pertaining to the risks and benefits of estrogen replacement therapy were compared.  The possible limitations of these studies were discussed including the age stratification of the WHI study.  The possible increased role of Progesterone in these studies was discussed in detail.  Different types of hormone formulation and methods of taking hormone replacement therapy were discussed. Literature on HRT was made available, and I believe that after answering all of the patient's questions she has an adequate and informed understanding of the risks and benefits of HRT. We will begin Estrace daily and expect that this will reduce her menopausal symptoms. Orders Orders Placed This Encounter  Procedures  . Follicle stimulating hormone     Meds ordered this encounter  Medications  . estradiol (ESTRACE) 1 MG tablet    Sig: Take 1 tablet (1 mg total) by mouth daily.    Dispense:  90 tablet  Refill:  0      F/U  Return in about 3 months (around 08/03/2020). I spent 35 minutes involved in the care of this patient preparing to see the patient by obtaining and reviewing her medical history (including labs, imaging tests and prior procedures), documenting clinical information in the electronic health record (EHR), counseling and coordinating care plans, writing and sending prescriptions, ordering tests or procedures and directly communicating with the patient by discussing pertinent items from her history and physical exam as well as detailing my assessment  and plan as noted above so that she has an informed understanding.  All of her questions were answered.  Finis Bud, M.D. 05/04/2020 12:13 PM

## 2020-05-04 NOTE — Telephone Encounter (Signed)
Called pt asking if she can upload a copy of her insurance card to her my chart or fax it over to our office. I gave her the fax number. The pt was told that the nurse needs it for her visit from today and the one we have is cut off. The pt verbally understood and said that she will do it as soon as she gets home. I thanked the pt.

## 2020-05-05 ENCOUNTER — Encounter: Payer: 59 | Admitting: Obstetrics and Gynecology

## 2020-05-05 LAB — FOLLICLE STIMULATING HORMONE: FSH: 68 m[IU]/mL

## 2020-05-12 ENCOUNTER — Other Ambulatory Visit: Payer: Self-pay | Admitting: Physician Assistant

## 2020-05-12 DIAGNOSIS — K219 Gastro-esophageal reflux disease without esophagitis: Secondary | ICD-10-CM

## 2020-05-25 ENCOUNTER — Telehealth (INDEPENDENT_AMBULATORY_CARE_PROVIDER_SITE_OTHER): Payer: 59 | Admitting: Adult Health

## 2020-05-25 ENCOUNTER — Encounter: Payer: Self-pay | Admitting: Adult Health

## 2020-05-25 DIAGNOSIS — J069 Acute upper respiratory infection, unspecified: Secondary | ICD-10-CM | POA: Insufficient documentation

## 2020-05-25 DIAGNOSIS — M7070 Other bursitis of hip, unspecified hip: Secondary | ICD-10-CM | POA: Insufficient documentation

## 2020-05-25 NOTE — Progress Notes (Signed)
MyChart Video Visit    Virtual Visit via Video Note   This visit type was conducted due to national recommendations for restrictions regarding the COVID-19 Pandemic (e.g. social distancing) in an effort to limit this patient's exposure and mitigate transmission in our community. This patient is at least at moderate risk for complications without adequate follow up. This format is felt to be most appropriate for this patient at this time. Physical exam was limited by quality of the video and audio technology used for the visit.   Patient location: at home  Provider location: Provider: Provider's office at  [facility], [place].     I discussed the limitations of evaluation and management by telemedicine and the availability of in person appointments. The patient expressed understanding and agreed to proceed.  Patient: Vicki Reese   DOB: May 05, 1971   49 y.o. Female  MRN: [id number] Visit Date: 05/25/2020  Today's healthcare provider: Marcille Buffy, FNP   No chief complaint on file.  Subjective    URI  This is a new problem. The current episode started yesterday (started morning started with symptoms ). The problem has been unchanged. There has been no fever. Associated symptoms include congestion, ear pain (right ear is hurting ), rhinorrhea, sinus pain and a sore throat. Pertinent negatives include no abdominal pain, chest pain, coughing, diarrhea, dysuria, headaches, joint pain, joint swelling, nausea, neck pain, plugged ear sensation, rash, sneezing, swollen glands, vomiting or wheezing. She has tried NSAIDs for the symptoms. The treatment provided mild relief.    Right ear pain, no drainage. She did keep nursery at church last Sunday.  She has a cough, post nasal and dry cough.  She has a burning scratchy throat she reports.   Onset 1 day ago.   Patient  denies any fever, body aches,chills, rash, chest pain, shortness of breath, nausea,  vomiting, or diarrhea.   Patient Active Problem List   Diagnosis Date Noted  . Bursitis of hip 05/25/2020  . Viral upper respiratory tract infection 05/25/2020  . Family history of breast cancer in mother 01/09/2018  . Breast nodule 01/09/2018  . 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 Status post laparoscopic assisted vaginal hysterectomy (LAVH) 06/18/2016  . Chronic pelvic pain in female 05/01/2016  . Body mass index (BMI) of 30.0-30.9 in adult 11/24/2015  . Obesity 11/24/2015  . Migraine with aura and without status migrainosus, not intractable 11/24/2015  . Menstrual migraine 11/07/2015  . Cannot sleep 09/28/2015  . Pre-diabetes 03/21/2015  . Fatigue 03/21/2015  . Endometriosis determined by laparoscopy 01/19/2015  . Depression 01/17/2015  . Patellofemoral stress syndrome 01/17/2015   Past Medical History:  Diagnosis Date  . Acid reflux   . Anxiety   . Menstrual migraine 11/07/2015   Past Surgical History:  Procedure Laterality Date  . ABDOMINAL HYSTERECTOMY    . BREAST BIOPSY Right 01/28/2018   benign  . DIAGNOSTIC LAPAROSCOPY    . DILATION AND CURETTAGE OF UTERUS    . LAPAROSCOPIC ASSISTED VAGINAL HYSTERECTOMY N/A 06/18/2016   Procedure: LAPAROSCOPIC ASSISTED VAGINAL HYSTERECTOMY WITH BILATERAL SALPINGECTOMY;  Surgeon: Brayton Mars, MD;  Location: [facility] ORS;  Service: Gynecology;  Laterality: N/A;  . laparoscopy     Social History   Tobacco Use  . Smoking status: Former Smoker    Quit date: 08/06/2006    Years since quitting: 13.8  . Smokeless tobacco: Never Used  . Tobacco comment: QUIT IN 2008  Vaping Use  . Vaping Use: Never  used  Substance Use Topics  . Alcohol use: Yes    Alcohol/week: 0.0 standard drinks    Comment: OCCASIONALLY  . Drug use: No   Social History   Socioeconomic History  . Marital status: Divorced    Spouse name: Not on file  . Number of children: Not on file  . Years of education: Not on file  . Highest education level: Not on  file  Occupational History  . Not on file  Tobacco Use  . Smoking status: Former Smoker    Quit date: 08/06/2006    Years since quitting: 13.8  . Smokeless tobacco: Never Used  . Tobacco comment: QUIT IN 2008  Vaping Use  . Vaping Use: Never used  Substance and Sexual Activity  . Alcohol use: Yes    Alcohol/week: 0.0 standard drinks    Comment: OCCASIONALLY  . Drug use: No  . Sexual activity: Yes    Birth control/protection: Surgical  Other Topics Concern  . Not on file  Social History Narrative  . Not on file   Social Determinants of Health   Financial Resource Strain:   . Difficulty of Paying Living Expenses: Not on file  Food Insecurity:   . Worried About Charity fundraiser in the Last Year: Not on file  . Ran Out of Food in the Last Year: Not on file  Transportation Needs:   . Lack of Transportation (Medical): Not on file  . Lack of Transportation (Non-Medical): Not on file  Physical Activity:   . Days of Exercise per Week: Not on file  . Minutes of Exercise per Session: Not on file  Stress:   . Feeling of Stress : Not on file  Social Connections:   . Frequency of Communication with Friends and Family: Not on file  . Frequency of Social Gatherings with Friends and Family: Not on file  . Attends Religious Services: Not on file  . Active Member of Clubs or Organizations: Not on file  . Attends Archivist Meetings: Not on file  . Marital Status: Not on file  Intimate Partner Violence:   . Fear of Current or Ex-Partner: Not on file  . Emotionally Abused: Not on file  . Physically Abused: Not on file  . Sexually Abused: Not on file   Family Status  Relation Name Status  . Mother  Alive  . Father  Alive  . Brother adopted [employer]  . PGM  (Not Specified)  . Neg Hx  (Not Specified)   Family History  Problem Relation Age of Onset  . Breast cancer Mother 19  . Lupus Father   . Diabetes Father   . Stroke Father   . Heart disease Paternal Grandmother    . Ovarian cancer Neg Hx   . Colon cancer Neg Hx    No Known Allergies    Medications: Outpatient Medications Prior to Visit  Medication Sig  . buPROPion (WELLBUTRIN XL) 300 MG 24 hr tablet TAKE 1 TABLET BY MOUTH EVERY DAY  . estradiol (ESTRACE) 1 MG tablet Take 1 tablet (1 mg total) by mouth daily.  Marland Kitchen ibuprofen (ADVIL,MOTRIN) 200 MG tablet Take 800 mg by mouth every 6 (six) hours as needed for mild pain.  . nitrofurantoin (MACRODANTIN) 100 MG capsule Take 100 mg by mouth at bedtime.  . pantoprazole (PROTONIX) 40 MG tablet Take 1 tablet (40 mg total) by mouth 2 (two) times daily before a meal.   No facility-administered medications prior to visit.  Review of Systems  HENT: Positive for congestion, ear pain (right ear is hurting ), rhinorrhea, sinus pain and sore throat. Negative for sneezing.   Respiratory: Negative for cough and wheezing.   Cardiovascular: Negative for chest pain.  Gastrointestinal: Negative for abdominal pain, diarrhea, nausea and vomiting.  Genitourinary: Negative for dysuria.  Musculoskeletal: Negative for joint pain and neck pain.  Skin: Negative for rash.  Neurological: Negative for headaches.    Last CBC Lab Results  Component Value Date   WBC 5.1 04/04/2020   HGB 11.7 04/04/2020   HCT 35.4 04/04/2020   MCV 96 04/04/2020   MCH 31.7 04/04/2020   RDW 11.6 (L) 04/04/2020   PLT 262 93/81/8299   Last metabolic panel Lab Results  Component Value Date   GLUCOSE 125 (H) 04/04/2020   NA 139 04/04/2020   K 4.2 04/04/2020   CL 102 04/04/2020   CO2 23 04/04/2020   BUN 14 04/04/2020   CREATININE 0.78 04/04/2020   GFRNONAA 91 04/04/2020   GFRAA 105 04/04/2020   CALCIUM 9.2 04/04/2020   PROT 7.0 04/04/2020   ALBUMIN 4.2 04/04/2020   LABGLOB 2.8 04/04/2020   AGRATIO 1.5 04/04/2020   BILITOT 0.2 04/04/2020   ALKPHOS 54 04/04/2020   AST 12 04/04/2020   ALT 12 04/04/2020   ANIONGAP 5 (L) 01/06/2014   Last lipids Lab Results  Component Value  Date   CHOL 174 04/04/2020   HDL 72 04/04/2020   LDLCALC 91 04/04/2020   TRIG 57 04/04/2020   Last hemoglobin A1c Lab Results  Component Value Date   HGBA1C 5.5 03/21/2015   Last thyroid functions Lab Results  Component Value Date   TSH 1.020 04/04/2020   Last vitamin D No results found for: 25OHVITD2, 25OHVITD3, VD25OH Last vitamin B12 and Folate No results found for: VITAMINB12, FOLATE    Objective    LMP 06/06/2016 (Approximate)  BP Readings from Last 3 Encounters:  05/04/20 117/83  04/22/20 104/67  03/31/20 111/73      Physical Exam    Patient is alert and oriented and responsive to questions Engages in conversation with provider. Speaks in full sentences without any pauses without any shortness of breath or distress.    Assessment & Plan     Viral upper respiratory tract infection - Plan: COVID-19, Flu A+B and RSV  Onset of symptoms 1 day ago. Suspect viral etiology at this point. Work note sent to [employer].   Discussed symptom management. Warm salt water gargles ok, and throat lozenges. Mucinex/ decongestant per package instructions ok.  Quarantine per [employer] guidelines.  Increase hydration and rest.  She will test on 05/26/20 in office parking lot wear mask and stay in car 430 pm or slightly after.   Strict return precautions discussed.   Orders Placed This Encounter  Procedures  . COVID-19, Flu A+B and RSV    Red Flags discussed. The patient was given clear instructions to go to ER or return to medical center if any red flags develop, symptoms do not improve, worsen or new problems develop. They verbalized understanding.  Return in about 1 week (around 06/01/2020), or if symptoms worsen or fail to improve, for at any time for any worsening symptoms, Go to Emergency room/ urgent care if worse.     I discussed the assessment and treatment plan with the patient. The patient was provided an opportunity to ask questions and all were answered. The patient  agreed with the plan and demonstrated an understanding of the instructions.  The patient was advised to call back or seek an in-person evaluation if the symptoms worsen or if the condition fails to improve as anticipated.  I provided 25  minutes of non-face-to-face time during this encounter.  I discussed the limitations of evaluation and management by telemedicine and the availability of in person appointments. The patient expressed understanding and agreed to proceed.   Marcille Buffy, [place] [phone] (phone) [phone] (fax)  [place]

## 2020-05-25 NOTE — Addendum Note (Signed)
Addended by: Minette Headland on: 05/25/2020 04:52 PM   Modules accepted: Orders

## 2020-05-25 NOTE — Patient Instructions (Signed)

## 2020-05-26 ENCOUNTER — Other Ambulatory Visit: Payer: Self-pay | Admitting: Adult Health

## 2020-05-27 ENCOUNTER — Other Ambulatory Visit: Payer: Self-pay | Admitting: Adult Health

## 2020-05-27 ENCOUNTER — Encounter: Payer: Self-pay | Admitting: Adult Health

## 2020-05-27 DIAGNOSIS — H9209 Otalgia, unspecified ear: Secondary | ICD-10-CM | POA: Insufficient documentation

## 2020-05-27 DIAGNOSIS — R059 Cough, unspecified: Secondary | ICD-10-CM | POA: Insufficient documentation

## 2020-05-27 MED ORDER — PREDNISONE 10 MG (21) PO TBPK: ORAL_TABLET | ORAL | 0 refills | Status: DC

## 2020-05-27 MED ORDER — BENZONATATE 100 MG PO CAPS: 100.0000 mg | ORAL_CAPSULE | Freq: Three times a day (TID) | ORAL | 0 refills | Status: DC | PRN

## 2020-05-27 MED ORDER — AMOXICILLIN-POT CLAVULANATE 875-125 MG PO TABS: 1.0000 | ORAL_TABLET | Freq: Two times a day (BID) | ORAL | 0 refills | Status: DC

## 2020-05-27 NOTE — Progress Notes (Signed)
Meds ordered this encounter  Medications  . predniSONE (STERAPRED UNI-PAK 21 TAB) 10 MG (21) TBPK tablet    Sig: PO: Take 6 tablets on day 1:Take 5 tablets day 2:Take 4 tablets day 3: Take 3 tablets day 4:Take 2 tablets day five: 5 Take 1 tablet day 6    Dispense:  21 tablet    Refill:  0  . amoxicillin-clavulanate (AUGMENTIN) 875-125 MG tablet    Sig: Take 1 tablet by mouth 2 (two) times daily.    Dispense:  20 tablet    Refill:  0  . benzonatate (TESSALON) 100 MG capsule    Sig: Take 1 capsule (100 mg total) by mouth 3 (three) times daily as needed for cough.    Dispense:  20 capsule    Refill:  0

## 2020-05-28 LAB — COVID-19, FLU A+B AND RSV
Influenza A, NAA: NOT DETECTED
Influenza B, NAA: NOT DETECTED
RSV, NAA: NOT DETECTED
SARS-CoV-2, NAA: NOT DETECTED

## 2020-05-29 NOTE — Progress Notes (Signed)
Results negative for covid, flu A & B, and RSV,

## 2020-06-27 ENCOUNTER — Other Ambulatory Visit (HOSPITAL_COMMUNITY): Payer: Self-pay | Admitting: Neurology

## 2020-06-27 ENCOUNTER — Other Ambulatory Visit: Payer: Self-pay | Admitting: Neurology

## 2020-06-27 DIAGNOSIS — G629 Polyneuropathy, unspecified: Secondary | ICD-10-CM

## 2020-07-06 ENCOUNTER — Other Ambulatory Visit: Payer: Self-pay

## 2020-07-06 ENCOUNTER — Ambulatory Visit
Admission: RE | Admit: 2020-07-06 | Discharge: 2020-07-06 | Disposition: A | Discharge status: Home / Self Care | Payer: 59 | Source: Ambulatory Visit | Attending: Neurology | Admitting: Neurology

## 2020-07-06 DIAGNOSIS — G629 Polyneuropathy, unspecified: Secondary | ICD-10-CM | POA: Diagnosis not present

## 2020-07-25 ENCOUNTER — Other Ambulatory Visit: Payer: Self-pay | Admitting: Obstetrics and Gynecology

## 2020-07-25 DIAGNOSIS — N951 Menopausal and female climacteric states: Secondary | ICD-10-CM

## 2020-08-16 ENCOUNTER — Other Ambulatory Visit: Payer: Self-pay | Admitting: Physician Assistant

## 2020-08-18 ENCOUNTER — Other Ambulatory Visit: Payer: Self-pay

## 2020-08-18 ENCOUNTER — Encounter: Payer: Self-pay | Admitting: Obstetrics and Gynecology

## 2020-08-18 ENCOUNTER — Ambulatory Visit (INDEPENDENT_AMBULATORY_CARE_PROVIDER_SITE_OTHER): Payer: 59 | Admitting: Obstetrics and Gynecology

## 2020-08-18 VITALS — BP 112/76 | HR 92 | Ht 63.5 in | Wt 158.9 lb

## 2020-08-18 DIAGNOSIS — N951 Menopausal and female climacteric states: Secondary | ICD-10-CM

## 2020-08-18 NOTE — Progress Notes (Signed)
HPI:      [name] is a 50 y.o. [id number] who LMP was Patient's last menstrual period was 06/06/2016 (approximate).  Subjective:   She presents today for follow-up of ERT.  She was experiencing brain fog, difficulty sleeping, lack of energy etc.  She began ERT (patient has had a hysterectomy) and she likes it very well.  She says it has made a large difference for her and she would like to continue it.    Hx: The following portions of the patient's history were reviewed and updated as appropriate:             She  has a past medical history of Acid reflux, Anxiety, and Menstrual migraine (11/07/2015). She does not have any pertinent problems on file. She  has a past surgical history that includes Dilation and curettage of uterus; laparoscopy; Diagnostic laparoscopy; Laparoscopic assisted vaginal hysterectomy (N/A, 06/18/2016); Breast biopsy (Right, 01/28/2018); and Abdominal hysterectomy. Her family history includes Breast cancer (age of onset: 67) in her mother; Diabetes in her father; Heart disease in her paternal grandmother; Lupus in her father; Stroke in her father. She  reports that she quit smoking about 14 years ago. She has never used smokeless tobacco. She reports current alcohol use. She reports that she does not use drugs. She has a current medication list which includes the following prescription(s): bupropion, estradiol, ibuprofen, nitrofurantoin, and pantoprazole. She has No Known Allergies.       Review of Systems:  Review of Systems  Constitutional: Denied constitutional symptoms, night sweats, recent illness, fatigue, fever, insomnia and weight loss.  Eyes: Denied eye symptoms, eye pain, photophobia, vision change and visual disturbance.  Ears/Nose/Throat/Neck: Denied ear, nose, throat or neck symptoms, hearing loss, nasal discharge, sinus congestion and sore throat.  Cardiovascular: Denied cardiovascular symptoms, arrhythmia, chest pain/pressure, edema, exercise  intolerance, orthopnea and palpitations.  Respiratory: Denied pulmonary symptoms, asthma, pleuritic pain, productive sputum, cough, dyspnea and wheezing.  Gastrointestinal: Denied, gastro-esophageal reflux, melena, nausea and vomiting.  Genitourinary: Denied genitourinary symptoms including symptomatic vaginal discharge, pelvic relaxation issues, and urinary complaints.  Musculoskeletal: Denied musculoskeletal symptoms, stiffness, swelling, muscle weakness and myalgia.  Dermatologic: Denied dermatology symptoms, rash and scar.  Neurologic: Denied neurology symptoms, dizziness, headache, neck pain and syncope.  Psychiatric: Denied psychiatric symptoms, anxiety and depression.  Endocrine: Denied endocrine symptoms including hot flashes and night sweats.   Meds:   Current Outpatient Medications on File Prior to Visit  Medication Sig Dispense Refill  . buPROPion (WELLBUTRIN XL) 300 MG 24 hr tablet TAKE 1 TABLET BY MOUTH EVERY DAY 90 tablet 0  . estradiol (ESTRACE) 1 MG tablet TAKE 1 TABLET BY MOUTH EVERY DAY 90 tablet 0  . ibuprofen (ADVIL,MOTRIN) 200 MG tablet Take 800 mg by mouth every 6 (six) hours as needed for mild pain.    . nitrofurantoin (MACRODANTIN) 100 MG capsule Take 100 mg by mouth at bedtime.    . pantoprazole (PROTONIX) 40 MG tablet Take 1 tablet (40 mg total) by mouth 2 (two) times daily before a meal. 180 tablet 1   No current facility-administered medications on file prior to visit.          Objective:     Vitals:   08/18/20 0856  BP: 112/76  Pulse: 92   Filed Weights   08/18/20 0856  Weight: 158 lb 14.4 oz (72.1 kg)                Assessment:    [id number] Patient  Active Problem List   Diagnosis Date Noted  . Cough 05/27/2020  . Otalgia 05/27/2020  . Bursitis of hip 05/25/2020  . Viral upper respiratory tract infection 05/25/2020  . Family history of breast cancer in mother 01/09/2018  . Breast nodule 01/09/2018  . 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  Status post laparoscopic assisted vaginal hysterectomy (LAVH) 06/18/2016  . Chronic pelvic pain in female 05/01/2016  . Body mass index (BMI) of 30.0-30.9 in adult 11/24/2015  . Obesity 11/24/2015  . Migraine with aura and without status migrainosus, not intractable 11/24/2015  . Menstrual migraine 11/07/2015  . Cannot sleep 09/28/2015  . Pre-diabetes 03/21/2015  . Fatigue 03/21/2015  . Endometriosis determined by laparoscopy 01/19/2015  . Depression 01/17/2015  . Patellofemoral stress syndrome 01/17/2015     1. Symptomatic menopausal or female climacteric states     Patient doing very well on ERT.   Plan:            1.  Continue ERT. Orders No orders of the defined types were placed in this encounter.   No orders of the defined types were placed in this encounter.     F/U  Return for Annual Physical. I spent 20 minutes involved in the care of this patient preparing to see the patient by obtaining and reviewing her medical history (including labs, imaging tests and prior procedures), documenting clinical information in the electronic health record (EHR), counseling and coordinating care plans, writing and sending prescriptions, ordering tests or procedures and directly communicating with the patient by discussing pertinent items from her history and physical exam as well as detailing my assessment and plan as noted above so that she has an informed understanding.  All of her questions were answered.  Finis Bud, M.D. 08/18/2020 9:12 AM

## 2020-08-29 ENCOUNTER — Encounter: Payer: Self-pay | Admitting: Physician Assistant

## 2020-08-29 DIAGNOSIS — N390 Urinary tract infection, site not specified: Secondary | ICD-10-CM

## 2020-08-29 MED ORDER — NITROFURANTOIN MACROCRYSTAL 100 MG PO CAPS: 100.0000 mg | ORAL_CAPSULE | Freq: Every day | ORAL | 1 refills | Status: DC

## 2020-09-03 ENCOUNTER — Other Ambulatory Visit: Payer: Self-pay | Admitting: Physician Assistant

## 2020-09-03 DIAGNOSIS — N390 Urinary tract infection, site not specified: Secondary | ICD-10-CM

## 2020-09-05 ENCOUNTER — Ambulatory Visit (INDEPENDENT_AMBULATORY_CARE_PROVIDER_SITE_OTHER): Payer: 59 | Admitting: Physician Assistant

## 2020-09-05 ENCOUNTER — Encounter: Payer: Self-pay | Admitting: Physician Assistant

## 2020-09-05 ENCOUNTER — Other Ambulatory Visit: Payer: Self-pay

## 2020-09-05 VITALS — BP 108/72 | HR 86 | Temp 98.2°F | Resp 16 | Wt 160.0 lb

## 2020-09-05 DIAGNOSIS — N390 Urinary tract infection, site not specified: Secondary | ICD-10-CM | POA: Diagnosis not present

## 2020-09-05 DIAGNOSIS — R300 Dysuria: Secondary | ICD-10-CM | POA: Diagnosis not present

## 2020-09-05 MED ORDER — CEPHALEXIN 500 MG PO CAPS: 500.0000 mg | ORAL_CAPSULE | Freq: Two times a day (BID) | ORAL | 0 refills | Status: DC

## 2020-09-05 MED ORDER — NITROFURANTOIN MACROCRYSTAL 100 MG PO CAPS: 100.0000 mg | ORAL_CAPSULE | Freq: Every day | ORAL | 1 refills | Status: DC

## 2020-09-05 NOTE — Patient Instructions (Signed)
Urinary Tract Infection, Adult A urinary tract infection (UTI) is an infection of any part of the urinary tract. The urinary tract includes:  The kidneys.  The ureters.  The bladder.  The urethra. These organs make, store, and get rid of pee (urine) in the body. What are the causes? This infection is caused by germs (bacteria) in your genital area. These germs grow and cause swelling (inflammation) of your urinary tract. What increases the risk? The following factors may make you more likely to develop this condition:  Using a small, thin tube (catheter) to drain pee.  Not being able to control when you pee or poop (incontinence).  Being female. If you are female, these things can increase the risk: ? Using these methods to prevent pregnancy:  A medicine that kills sperm (spermicide).  A device that blocks sperm (diaphragm). ? Having low levels of a female hormone (estrogen). ? Being pregnant. You are more likely to develop this condition if:  You have genes that add to your risk.  You are sexually active.  You take antibiotic medicines.  You have trouble peeing because of: ? A prostate that is bigger than normal, if you are female. ? A blockage in the part of your body that drains pee from the bladder. ? A kidney stone. ? A nerve condition that affects your bladder. ? Not getting enough to drink. ? Not peeing often enough.  You have other conditions, such as: ? Diabetes. ? A weak disease-fighting system (immune system). ? Sickle cell disease. ? Gout. ? Injury of the spine. What are the signs or symptoms? Symptoms of this condition include:  Needing to pee right away.  Peeing small amounts often.  Pain or burning when peeing.  Blood in the pee.  Pee that smells bad or not like normal.  Trouble peeing.  Pee that is cloudy.  Fluid coming from the vagina, if you are female.  Pain in the belly or lower back. Other symptoms include:  Vomiting.  Not  feeling hungry.  Feeling mixed up (confused). This may be the first symptom in older adults.  Being tired and grouchy (irritable).  A fever.  Watery poop (diarrhea). How is this treated?  Taking antibiotic medicine.  Taking other medicines.  Drinking enough water. In some cases, you may need to see a specialist. Follow these instructions at home: Medicines  Take over-the-counter and prescription medicines only as told by your doctor.  If you were prescribed an antibiotic medicine, take it as told by your doctor. Do not stop taking it even if you start to feel better. General instructions  Make sure you: ? Pee until your bladder is empty. ? Do not hold pee for a long time. ? Empty your bladder after sex. ? Wipe from front to back after peeing or pooping if you are a female. Use each tissue one time when you wipe.  Drink enough fluid to keep your pee pale yellow.  Keep all follow-up visits.   Contact a doctor if:  You do not get better after 1-2 days.  Your symptoms go away and then come back. Get help right away if:  You have very bad back pain.  You have very bad pain in your lower belly.  You have a fever.  You have chills.  You feeling like you will vomit or you vomit. Summary  A urinary tract infection (UTI) is an infection of any part of the urinary tract.  This condition is caused by  germs in your genital area.  There are many risk factors for a UTI.  Treatment includes antibiotic medicines.  Drink enough fluid to keep your pee pale yellow. This information is not intended to replace advice given to you by your health care provider. Make sure you discuss any questions you have with your health care provider. Document Revised: 03/04/2020 Document Reviewed: 03/04/2020 Elsevier Patient Education  [place].

## 2020-09-05 NOTE — Progress Notes (Signed)
Established patient visit   Patient: Vicki Reese   DOB: 10-Aug-1970   50 y.o. Female  MRN: [id number] Visit Date: 09/05/2020  Today's healthcare provider: Mar Daring, PA-C   Chief Complaint  Patient presents with  . Urinary Tract Infection   Subjective    HPI HPI    Urinary Tract Infection    This is a new problem.  Recent episode started in the past 7 days.  The problem has been unchanged since onset.  Severity of the pain is moderate.  The problem occurs every urination.  Abdominal Pain: Present.  Back Pain: Present.  Chills: Present.  Cloudy malodorus urine: Present.  Constipation: Absent.  Cramping: Absent.  Diarrhea: Absent.  Discharge: Absent.  Fever: Present.  Hematuria: Absent.  Nausea: Absent.  Vomiting: Absent.          Comments    Patient has been taking OTC medication for painful urination.        Last edited by Dorian Pod, [place] on 09/05/2020  9:43 AM. (History)      Patient does have history of recurrent UTI. Had been on Macrobid 17m once daily for preventative. Just ran out last week. Now with current pelvic pain, dysuria, nausea, urine frequency.  Patient Active Problem List   Diagnosis Date Noted  . Cough 05/27/2020  . Otalgia 05/27/2020  . Bursitis of hip 05/25/2020  . Viral upper respiratory tract infection 05/25/2020  . Family history of breast cancer in mother 01/09/2018  . Breast nodule 01/09/2018  . 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 Status post laparoscopic assisted vaginal hysterectomy (LAVH) 06/18/2016  . Chronic pelvic pain in female 05/01/2016  . Body mass index (BMI) of 30.0-30.9 in adult 11/24/2015  . Obesity 11/24/2015  . Migraine with aura and without status migrainosus, not intractable 11/24/2015  . Menstrual migraine 11/07/2015  . Cannot sleep 09/28/2015  . Pre-diabetes 03/21/2015  . Fatigue 03/21/2015  . Endometriosis determined by laparoscopy 01/19/2015  . Depression 01/17/2015  . Patellofemoral  stress syndrome 01/17/2015   Past Medical History:  Diagnosis Date  . Acid reflux   . Anxiety   . Menstrual migraine 11/07/2015       Medications: Outpatient Medications Prior to Visit  Medication Sig  . buPROPion (WELLBUTRIN XL) 300 MG 24 hr tablet TAKE 1 TABLET BY MOUTH EVERY DAY  . estradiol (ESTRACE) 1 MG tablet TAKE 1 TABLET BY MOUTH EVERY DAY  . gabapentin (NEURONTIN) 100 MG capsule Take 100 mg by mouth 3 (three) times daily.  .Marland Kitchenibuprofen (ADVIL,MOTRIN) 200 MG tablet Take 800 mg by mouth every 6 (six) hours as needed for mild pain.  . pantoprazole (PROTONIX) 40 MG tablet Take 1 tablet (40 mg total) by mouth 2 (two) times daily before a meal.  . [DISCONTINUED] nitrofurantoin (MACRODANTIN) 100 MG capsule Take 1 capsule (100 mg total) by mouth at bedtime.   No facility-administered medications prior to visit.    Review of Systems  Constitutional: Negative.   Respiratory: Negative.   Cardiovascular: Negative.   Gastrointestinal: Positive for abdominal pain and nausea. Negative for vomiting.  Genitourinary: Positive for dysuria, frequency, pelvic pain and urgency. Negative for flank pain and hematuria.    @AMBLABREVIEWLINK @  Objective    BP 108/72 (BP Location: Left Arm, Patient Position: Sitting, Cuff Size: Normal)   Pulse 86   Temp 98.2 F (36.8 C) (Oral)   Resp 16   Wt 160 lb (72.6 kg)   LMP 06/06/2016 (Approximate)   SpO2  99%   BMI 27.90 kg/m  BP Readings from Last 3 Encounters:  09/05/20 108/72  08/18/20 112/76  05/04/20 117/83   Wt Readings from Last 3 Encounters:  09/05/20 160 lb (72.6 kg)  08/18/20 158 lb 14.4 oz (72.1 kg)  05/04/20 151 lb 9.6 oz (68.8 kg)      Physical Exam Constitutional:      General: She is not in acute distress.    Appearance: Normal appearance. She is well-developed, normal weight and well-nourished. She is not ill-appearing or diaphoretic.  Cardiovascular:     Rate and Rhythm: Normal rate and regular rhythm.     Heart  sounds: Normal heart sounds. No murmur heard. No friction rub. No gallop.   Pulmonary:     Effort: Pulmonary effort is normal. No respiratory distress.     Breath sounds: Normal breath sounds. No wheezing or rales.  Abdominal:     General: Bowel sounds are normal. There is no distension.     Palpations: Abdomen is soft. There is no hepatosplenomegaly or mass.     Tenderness: There is abdominal tenderness in the suprapubic area. There is no CVA tenderness, right CVA tenderness, left CVA tenderness, guarding or rebound.  Skin:    General: Skin is warm and dry.  Neurological:     Mental Status: She is alert and oriented to person, place, and time.       No results found for any visits on 09/05/20.  Assessment & Plan     1. Dysuria UA unable to read due to AZO. Culture sent. - Urine Culture  2. Recurrent UTI No systemic symptoms. Worsening symptoms. Will treat empirically with Keflex as below. AZO can be continued for spasm.  Continue to push fluids. Urine sent for culture. Will follow up pending C&S results. She is to call if symptoms do not improve or if they worsen.  - nitrofurantoin (MACRODANTIN) 100 MG capsule; Take 1 capsule (100 mg total) by mouth at bedtime.  Dispense: 90 capsule; Refill: 1 - cephALEXin (KEFLEX) 500 MG capsule; Take 1 capsule (500 mg total) by mouth 2 (two) times daily.  Dispense: 20 capsule; Refill: 0   Return if symptoms worsen or fail to improve.      Reynolds Bowl, PA-C, have reviewed all documentation for this visit. The documentation on 09/05/20 for the exam, diagnosis, procedures, and orders are all accurate and complete.   Rubye Beach  [facility] [phone] (phone) [phone] (fax)  [place]

## 2020-09-08 ENCOUNTER — Other Ambulatory Visit: Payer: Self-pay | Admitting: Physician Assistant

## 2020-09-08 ENCOUNTER — Encounter: Payer: Self-pay | Admitting: Physician Assistant

## 2020-09-08 DIAGNOSIS — N3000 Acute cystitis without hematuria: Secondary | ICD-10-CM

## 2020-09-08 LAB — URINE CULTURE

## 2020-09-08 MED ORDER — CIPROFLOXACIN HCL 500 MG PO TABS: 500.0000 mg | ORAL_TABLET | Freq: Two times a day (BID) | ORAL | 0 refills | Status: DC

## 2020-09-08 NOTE — Telephone Encounter (Signed)
Changed to Bactrim due to cipro being on backorder

## 2020-09-08 NOTE — Telephone Encounter (Signed)
  Notes to clinic:      Pharmacy comment: Product Backordered/Unavailable:ON BACKORDER. PLEASE SEND ALTERNATIVE.     Requested Prescriptions  Pending Prescriptions Disp Refills   ciprofloxacin (CIPRO) 500 MG tablet [Pharmacy Med Name: CIPROFLOXACIN HCL 500 MG TAB] 10 tablet 0    Sig: TAKE 1 TABLET BY MOUTH TWICE A DAY      Off-Protocol Failed - 09/08/2020 12:28 PM      Failed - Medication not assigned to a protocol, review manually.      Passed - Valid encounter within last 12 months    Recent Outpatient Visits           3 days ago [place], [place]   3 months ago Viral upper respiratory tract infection   [facility] [place], Kelby Aline, FNP   5 months ago Annual physical exam   [place], [place]   9 months ago Recurrent UTI   [facility] Mar Daring, [place]   12 months ago [place], [place], [place]

## 2020-09-20 ENCOUNTER — Ambulatory Visit: Payer: 59 | Admitting: Dermatology

## 2020-09-20 ENCOUNTER — Other Ambulatory Visit: Payer: Self-pay

## 2020-09-20 DIAGNOSIS — R21 Rash and other nonspecific skin eruption: Secondary | ICD-10-CM | POA: Diagnosis not present

## 2020-09-20 MED ORDER — HYDROXYZINE HCL 25 MG PO TABS: ORAL_TABLET | ORAL | 1 refills | Status: DC

## 2020-09-20 MED ORDER — TRIAMCINOLONE ACETONIDE 0.1 % EX CREA: TOPICAL_CREAM | CUTANEOUS | 2 refills | Status: DC

## 2020-09-20 NOTE — Progress Notes (Signed)
   New Patient Visit  Subjective  Vicki Reese is a 50 y.o. female who presents for the following: Rash.  Rash started 2 weeks ago on her braline and inguinal creases. She changed fabric softener 4 weeks ago. She also started Bactrim DS on 09/08/20 for UTI. No new lotions, shampoo, or other products. No new foods. She has tried Cortisone Cream and oral Benadryl, with no improvement. She uses Dove soap in the shower and a moisturizer with aloe, which she has used for years.  No h/o eczema.  Never had rash like this before. Very itchy.  The following portions of the chart were reviewed this encounter and updated as appropriate:       Review of Systems:  No other skin or systemic complaints except as noted in HPI or Assessment and Plan.  Objective  Well appearing patient in no apparent distress; mood and affect are within normal limits.  A focused examination was performed including arms, trunk, legs. Relevant physical exam findings are noted in the Assessment and Plan.  Objective  trunk, extremities: Light pink scaly papules on arms, chest, lateral breasts, hips; small pink papules on back.   Assessment & Plan  Rash trunk, extremities  Dermatitis vs Drug Reaction to Bactrim DS.  Start TMC 0.1% Cream Apply to AA rash BID until improved 80g 2Rf. Avoid face, groin, axilla.  Start hydroxyzine 37m take 1-2 po qhs dsp #60 1Rf. May make drowsy.  Recommend mild soap and moisturizing cream 1-2 times daily.  May try Eucerin 12 Hour Itch Relief Calming Lotion.  Discussed oral Prednisone if rash spreading or worsening/not improving.  Topical steroids (such as triamcinolone, fluocinolone, fluocinonide, mometasone, clobetasol, halobetasol, betamethasone, hydrocortisone) can cause thinning and lightening of the skin if they are used for too long in the same area. Your physician has selected the right strength medicine for your problem and area affected on the body. Please use your medication  only as directed by your physician to prevent side effects.     triamcinolone (KENALOG) 0.1 % - trunk, extremities  hydrOXYzine (ATARAX/VISTARIL) 25 MG tablet - trunk, extremities  Return in about 2 weeks (around 10/04/2020).   Documentation: I have reviewed the above documentation for accuracy and completeness, and I agree with the above.  TBrendolyn PattyMD

## 2020-09-20 NOTE — Patient Instructions (Addendum)
Topical steroids (such as triamcinolone, fluocinolone, fluocinonide, mometasone, clobetasol, halobetasol, betamethasone, hydrocortisone) can cause thinning and lightening of the skin if they are used for too long in the same area. Your physician has selected the right strength medicine for your problem and area affected on the body. Please use your medication only as directed by your physician to prevent side effects.   Recommend mild soap and moisturizing cream 1-2 times daily.

## 2020-09-21 ENCOUNTER — Other Ambulatory Visit: Payer: Self-pay

## 2020-09-21 MED ORDER — PREDNISONE 5 MG PO TABS: ORAL_TABLET | ORAL | 0 refills | Status: DC

## 2020-09-21 NOTE — Progress Notes (Signed)
Patient called today and her rash has spread to her neck and feet. She is unable to sleep at night and would like to start oral Prednisone.  Take prednisone by mouth daily in morning with food starting at 60 mg dosage and gradually decreasing daily dose as directed until gone for a two week taper.  Patient was given written instructions.  Potential side effects reviewed. Dsp #100 0Rf sent to [place].

## 2020-09-22 ENCOUNTER — Other Ambulatory Visit: Payer: Self-pay

## 2020-09-22 ENCOUNTER — Encounter: Payer: Self-pay | Admitting: Dermatology

## 2020-09-22 DIAGNOSIS — R21 Rash and other nonspecific skin eruption: Secondary | ICD-10-CM

## 2020-09-22 MED ORDER — TRIAMCINOLONE ACETONIDE 0.1 % EX OINT: 1.0000 "application " | TOPICAL_OINTMENT | CUTANEOUS | 0 refills | Status: DC

## 2020-09-22 MED ORDER — TRIAMCINOLONE ACETONIDE 0.1 % EX CREA: TOPICAL_CREAM | CUTANEOUS | 0 refills | Status: DC

## 2020-09-22 NOTE — Progress Notes (Signed)
TMC 0.1% oint sent in b/c TMC 0.1% cr not approved until 10/12/20./sh

## 2020-09-22 NOTE — Progress Notes (Signed)
Pt sent MyChart message stating that due to surface area of body needing to be covered with TMC cream she has run out of her first tube and insurance won't allow refill until 10/12/20. Per Dr. Laurence Ferrari after reviewing last visit note, due to Dr. Nicole Kindred being out of office today and being closed until Monday, sent in 1 lb jar of TMC 0.1% cream to pharmacy.

## 2020-10-04 ENCOUNTER — Ambulatory Visit: Payer: 59 | Admitting: Dermatology

## 2020-10-05 ENCOUNTER — Ambulatory Visit (INDEPENDENT_AMBULATORY_CARE_PROVIDER_SITE_OTHER): Payer: 59 | Admitting: Dermatology

## 2020-10-05 ENCOUNTER — Other Ambulatory Visit: Payer: Self-pay

## 2020-10-05 DIAGNOSIS — R21 Rash and other nonspecific skin eruption: Secondary | ICD-10-CM | POA: Diagnosis not present

## 2020-10-05 NOTE — Progress Notes (Signed)
   Follow-Up Visit   Subjective  Vicki Reese is a 50 y.o. female who presents for the following: Rash (2 week rash f/u. Pt treating with TMC 0.1% cream and then switched to ointment due to an issue with insurance not allowing her a refill of the cream. Also treating with Hydroxyzine 25 mg. Pt using Aveeno moisturizer all over. She reports improvement and states that she is no longer itching. She states that she also reported reaction to pcp and asked not to be put on the Bactrim again in the future. ).    The following portions of the chart were reviewed this encounter and updated as appropriate:      Review of Systems: No other skin or systemic complaints except as noted in HPI or Assessment and Plan.   Objective  Well appearing patient in no apparent distress; mood and affect are within normal limits.  A focused examination was performed including arms and back. Relevant physical exam findings are noted in the Assessment and Plan.  Objective  bilateral arms: Scattered small healing excoriations on arms.  Trunk clear.    Assessment & Plan  Rash bilateral arms  Probable Drug eruption from Bactrim- improved with prednisone.   If arms start to itch again, restart TMC 0.1% oint to the aa's.  Bactrim added to allergy list  Other Related Medications triamcinolone (KENALOG) 0.1 % hydrOXYzine (ATARAX/VISTARIL) 25 MG tablet triamcinolone (KENALOG) 0.1 %  Return if symptoms worsen or fail to improve.   I, Harriett Sine, CMA, am acting as scribe for Brendolyn Patty, MD.  Documentation: I have reviewed the above documentation for accuracy and completeness, and I agree with the above.  Brendolyn Patty MD

## 2020-10-20 ENCOUNTER — Other Ambulatory Visit: Payer: Self-pay

## 2020-10-20 ENCOUNTER — Ambulatory Visit (INDEPENDENT_AMBULATORY_CARE_PROVIDER_SITE_OTHER): Payer: 59 | Admitting: Dermatology

## 2020-10-20 ENCOUNTER — Encounter: Payer: Self-pay | Admitting: Dermatology

## 2020-10-20 DIAGNOSIS — L299 Pruritus, unspecified: Secondary | ICD-10-CM | POA: Diagnosis not present

## 2020-10-20 DIAGNOSIS — R21 Rash and other nonspecific skin eruption: Secondary | ICD-10-CM | POA: Diagnosis not present

## 2020-10-20 MED ORDER — CLOBETASOL PROPIONATE 0.05 % EX SOLN: 1.0000 "application " | Freq: Two times a day (BID) | CUTANEOUS | 1 refills | Status: DC

## 2020-10-20 MED ORDER — PREDNISONE 5 MG PO TABS: ORAL_TABLET | ORAL | 0 refills | Status: DC

## 2020-10-20 NOTE — Progress Notes (Signed)
Follow-Up Visit   Subjective  Vicki Reese is a 50 y.o. female who presents for the following: Rash (Pt c/o rash on the body getting worse, rash went away when pt was taking Prednisone prescribed by Dr Nicole Kindred 2 weeks ago, rash flared back up on Tuesday. Using TMC cream and Hydroxyzine qhs. Took two Benadryl last night and got no relief. Pt  stopped by the pharmacist this morning and the pharmacist recommended taking like a Claritin or a Zyrtec for daytime. ).    The following portions of the chart were reviewed this encounter and updated as appropriate:   Tobacco  Allergies  Meds  Problems  Med Hx  Surg Hx  Fam Hx      Review of Systems:  No other skin or systemic complaints except as noted in HPI or Assessment and Plan.  Objective  Well appearing patient in no apparent distress; mood and affect are within normal limits.  A focused examination was performed including face,arns,chest,back . Relevant physical exam findings are noted in the Assessment and Plan.  Objective  Right flank: Scaly pink papules coalescing to plaques   Images             Assessment & Plan  Rash Right flank  Take prednisone by mouth daily in morning with food starting at 60 mg dosage and gradually decreasing daily dose as directed until gone for a two week taper.  Patient was given written instructions.  Potential side effects reviewed.   Start clobetasol in [place] as below.   Buy TWO 16oz jars of CeraVe moisturizing cream  [facility], [facility], [facility] (no prescription needed)  Costs about $15 per jar   Jar #1: Use as a moisturizer as needed. Can be applied to any area of the body. Use twice daily to unaffected areas.  Jar #2: Pour one 48m bottle of clobetasol 0.05% solution into jar, mix well. Label this jar to indicate the medication has been added. Use twice daily to affected areas. Do not apply to face, groin or underarms.  Moisturizer may burn or sting initially. Try for at  least 4 weeks.     Skin / nail biopsy - Right flank Type of biopsy: punch   Informed consent: discussed and consent obtained   Timeout: patient name, date of birth, surgical site, and procedure verified   Procedure prep:  Patient was prepped and draped in usual sterile fashion (the patient was cleansed and prepped) Prep type:  Isopropyl alcohol Anesthesia: the lesion was anesthetized in a standard fashion   Anesthetic:  1% lidocaine w/ epinephrine 1-100,000 buffered w/ 8.4% NaHCO3 Punch size:  4 mm Suture size:  4-0 Suture type: Prolene (polypropylene)   Hemostasis achieved with: suture, pressure and aluminum chloride   Outcome: patient tolerated procedure well   Post-procedure details: sterile dressing applied and wound care instructions given   Dressing type: bandage, petrolatum and pressure dressing    Specimen 1 - Surgical pathology Differential Diagnosis: R/O Hypersensitive vs Allergic contact dermatitis vs Eczema over Eczematous BP    Check Margins: No  Ordered Medications: clobetasol (TEMOVATE) 0.05 % external solution  Reordered Medications predniSONE (DELTASONE) 5 MG tablet  Other Related Medications hydrOXYzine (ATARAX/VISTARIL) 25 MG tablet  Itch Left Abdomen (side) - Upper  Significant itch  Start hydroxyzine 25 mg tablet take 1-2 po nightly as needed itch. Caution sedation. Pt advised not to take together with benadryl.  Recommend OTC Gold Bond Rapid Relief Anti-Itch cream (pramoxine + menthol) up to 3  times per day to areas that are itchy.   Return in about 2 weeks (around 11/03/2020) for suture removal .  I, Marye Round, CMA, am acting as scribe for Forest Gleason, MD .  Documentation: I have reviewed the above documentation for accuracy and completeness, and I agree with the above.  Forest Gleason, MD

## 2020-10-20 NOTE — Telephone Encounter (Signed)
Pt has to pick her son up from school and can't get here until about 3:30, will this be okay

## 2020-10-20 NOTE — Patient Instructions (Signed)
Wound Care Instructions  1. Cleanse wound gently with soap and water once a day then pat dry with clean gauze. Apply a thing coat of Petrolatum (petroleum jelly, "Vaseline") over the wound (unless you have an allergy to this). We recommend that you use a new, sterile tube of Vaseline. Do not pick or remove scabs. Do not remove the yellow or white "healing tissue" from the base of the wound.  2. Cover the wound with fresh, clean, nonstick gauze and secure with paper tape. You may use Band-Aids in place of gauze and tape if the would is small enough, but would recommend trimming much of the tape off as there is often too much. Sometimes Band-Aids can irritate the skin.  3. You should call the office for your biopsy report after 1 week if you have not already been contacted.  4. If you experience any problems, such as abnormal amounts of bleeding, swelling, significant bruising, significant pain, or evidence of infection, please call the office immediately.  5. FOR ADULT SURGERY PATIENTS: If you need something for pain relief you may take 1 extra strength Tylenol (acetaminophen) AND 2 Ibuprofen (265m each) together every 4 hours as needed for pain. (do not take these if you are allergic to them or if you have a reason you should not take them.) Typically, you may only need pain medication for 1 to 3 days.    Eczema Skin Care  Buy TWO 16oz jars of CeraVe moisturizing cream  [facility], [facility], [facility] (no prescription needed)  Costs about $15 per jar   Jar #1: Use as a moisturizer as needed. Can be applied to any area of the body. Use twice daily to unaffected areas.  Jar #2: Pour one 570mbottle of clobetasol 0.05% solution into jar, mix well. Label this jar to indicate the medication has been added. Use twice daily to affected areas. Do not apply to face, groin or underarms.  Moisturizer may burn or sting initially. Try for at least 4 weeks.  2 Week Prednisone Taper  You will be given a  prescription for 100 tablets of oral Prednisone. It is very important that you take this according to the exact schedule provided below. This type of regimen for taking medication is often called a "taper", because your dosage will steadily decrease over a two week period until it is discontinued altogether.  ALWAYS take this medicine with food to prevent it from irritating your stomach. You should also take your Prednisone during morning hours.  Call the clinic at [phone] you gain more than two pounds in one day, notice swelling anywhere on your body, have shortness of breath, black or red bowel movements, brown or red vomitus, desire to drink large amounts of fluids, a fever, or extreme weakness.   Oral Prednisone over Two Weeks  Day  Week 1  Week 2   1  12  tablets  7 tablets   2  12 tablets  6 tablets   3  11 tablets  5 tablets   4  10 tablets  4 tablets   5  10 tablets  3 tablets   6  9 tablets  2 tablets   7  8 tablets  1 tablet

## 2020-10-21 ENCOUNTER — Other Ambulatory Visit: Payer: Self-pay | Admitting: Physician Assistant

## 2020-10-21 DIAGNOSIS — K219 Gastro-esophageal reflux disease without esophagitis: Secondary | ICD-10-CM

## 2020-10-25 ENCOUNTER — Encounter: Payer: Self-pay | Admitting: Dermatology

## 2020-10-30 ENCOUNTER — Other Ambulatory Visit: Payer: Self-pay

## 2020-10-30 DIAGNOSIS — N951 Menopausal and female climacteric states: Secondary | ICD-10-CM

## 2020-11-01 ENCOUNTER — Other Ambulatory Visit: Payer: Self-pay

## 2020-11-01 ENCOUNTER — Encounter: Payer: Self-pay | Admitting: Dermatology

## 2020-11-01 DIAGNOSIS — N951 Menopausal and female climacteric states: Secondary | ICD-10-CM

## 2020-11-01 MED ORDER — ESTRADIOL 1 MG PO TABS: 1.0000 mg | ORAL_TABLET | Freq: Every day | ORAL | 0 refills | Status: DC

## 2020-11-02 ENCOUNTER — Other Ambulatory Visit: Payer: Self-pay

## 2020-11-02 ENCOUNTER — Ambulatory Visit (INDEPENDENT_AMBULATORY_CARE_PROVIDER_SITE_OTHER): Payer: 59 | Admitting: Dermatology

## 2020-11-02 DIAGNOSIS — R21 Rash and other nonspecific skin eruption: Secondary | ICD-10-CM | POA: Diagnosis not present

## 2020-11-02 MED ORDER — MUPIROCIN 2 % EX OINT: TOPICAL_OINTMENT | CUTANEOUS | 0 refills | Status: DC

## 2020-11-02 NOTE — Patient Instructions (Signed)

## 2020-11-02 NOTE — Progress Notes (Signed)
   Follow-Up Visit   Subjective  Vicki Reese is a 50 y.o. female who presents for the following: Follow-up (Patient here for 2 week biopsy follow-up. Rash is improved now since on oral Prednisone. Last pill is tomorrow. She used clobetasol/CeraVe mix until rash started improving, then switched to plain CeraVe Cream. [place] known tick bite[place]. She normally doesn't eat red meat, but has eaten red meat prior to rash appearing. Macrobid pill color has changed since her last prescription. ).  It is now listed as generic for Macrodantin, and color of pill is different.  She doesn't take any OTC meds or supplements.   The following portions of the chart were reviewed this encounter and updated as appropriate:       Review of Systems:  No other skin or systemic complaints except as noted in HPI or Assessment and Plan.  Objective  Well appearing patient in no apparent distress; mood and affect are within normal limits.  A focused examination was performed including face, trunk. Relevant physical exam findings are noted in the Assessment and Plan.  Objective  trunk, arms: Light pink macules on arms and abdomen; biopsy site of the right flank with ulceration and surrounding erythema at wound edge and fibrinous exudate.    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  Wound cleansed and sutures removed.  Discussed pathology results. Start mupirocin 2% ointment qd with bandage change until biopsy site healed.  Labs ordered today, Alpha-Gal panel. If negative, will recommend patient stop taking Niftofurantoin Macrocrystal (Macrodantin).  mupirocin ointment (BACTROBAN) 2 % - trunk, arms  Other Related Procedures Alpha-Gal Panel  Other Related Medications hydrOXYzine (ATARAX/VISTARIL) 25 MG tablet clobetasol  (TEMOVATE) 0.05 % external solution predniSONE (DELTASONE) 5 MG tablet  Return if symptoms worsen or fail to improve.   Documentation: I have reviewed the above documentation for accuracy and completeness, and I agree with the above.  Brendolyn Patty MD

## 2020-11-04 ENCOUNTER — Encounter: Payer: Self-pay | Admitting: Dermatology

## 2020-11-07 ENCOUNTER — Telehealth: Payer: Self-pay

## 2020-11-07 NOTE — Telephone Encounter (Signed)
Pt sent message about bx site being red and wonder if needing antibiotics.  I left pt message we could schedule her with a nurse this morning so we could evaluate the bx site.  Advised pt to call back so we could get her on the schedule./sh

## 2020-11-08 ENCOUNTER — Telehealth: Payer: Self-pay

## 2020-11-08 LAB — ALPHA-GAL PANEL
Allergen Lamb IgE: <0.1 kU/L
Beef IgE: <0.1 kU/L
IgE (Immunoglobulin E), Serum: 43 IU/mL (ref 6–495)
O215-IgE Alpha-Gal: 0.12 kU/L — AB
Pork IgE: <0.1 kU/L

## 2020-11-08 NOTE — Telephone Encounter (Signed)
-----   Message from Brendolyn Patty, MD sent at 11/08/2020 10:19 AM EDT ----- Alpha Gal result is equivocal/low.  Not sure if it is relevant or not.  She may need to see an allergist Velora Heckler) for further evaluation, to see if she should continue to avoid beef.

## 2020-11-08 NOTE — Telephone Encounter (Signed)
Left message for patient to call for lab results.

## 2020-11-18 ENCOUNTER — Other Ambulatory Visit: Payer: Self-pay | Admitting: Physician Assistant

## 2020-11-21 ENCOUNTER — Other Ambulatory Visit: Payer: Self-pay

## 2020-11-21 ENCOUNTER — Telehealth: Payer: Self-pay

## 2020-11-21 DIAGNOSIS — L309 Dermatitis, unspecified: Secondary | ICD-10-CM

## 2020-11-21 NOTE — Telephone Encounter (Signed)
Advised patient Alpha Gal lab result was equivocal/low and not sure if relevant or not. Discussed with patient she may need to see an allergist for further evaluation. Advised patient we can schedule appointment if she decides to pursue.

## 2020-11-21 NOTE — Telephone Encounter (Signed)
-----   Message from Brendolyn Patty, MD sent at 11/08/2020 10:19 AM EDT ----- Alpha Gal result is equivocal/low.  Not sure if it is relevant or not.  She may need to see an allergist Velora Heckler) for further evaluation, to see if she should continue to avoid beef.

## 2021-01-12 ENCOUNTER — Other Ambulatory Visit: Payer: Self-pay | Admitting: Rheumatology

## 2021-01-12 DIAGNOSIS — G8929 Other chronic pain: Secondary | ICD-10-CM

## 2021-01-23 ENCOUNTER — Other Ambulatory Visit: Payer: Self-pay

## 2021-01-23 ENCOUNTER — Ambulatory Visit
Admission: RE | Admit: 2021-01-23 | Discharge: 2021-01-23 | Disposition: A | Discharge status: Home / Self Care | Payer: 59 | Source: Ambulatory Visit | Attending: Rheumatology | Admitting: Rheumatology

## 2021-01-23 DIAGNOSIS — M79671 Pain in right foot: Secondary | ICD-10-CM | POA: Insufficient documentation

## 2021-01-23 DIAGNOSIS — G8929 Other chronic pain: Secondary | ICD-10-CM | POA: Diagnosis present

## 2021-01-23 MED ORDER — GADOBUTROL 1 MMOL/ML IV SOLN
7.0000 mL | Freq: Once | INTRAVENOUS | Status: AC | PRN
  Administered 2021-01-23: 7 mL via INTRAVENOUS

## 2021-01-26 ENCOUNTER — Other Ambulatory Visit: Payer: Self-pay

## 2021-01-26 DIAGNOSIS — N951 Menopausal and female climacteric states: Secondary | ICD-10-CM

## 2021-01-29 ENCOUNTER — Other Ambulatory Visit: Payer: Self-pay | Admitting: Obstetrics and Gynecology

## 2021-01-29 DIAGNOSIS — N951 Menopausal and female climacteric states: Secondary | ICD-10-CM

## 2021-01-31 MED ORDER — ESTRADIOL 1 MG PO TABS: 1.0000 mg | ORAL_TABLET | Freq: Every day | ORAL | 0 refills | Status: DC

## 2021-02-23 ENCOUNTER — Other Ambulatory Visit: Payer: Self-pay | Admitting: Physician Assistant

## 2021-02-23 ENCOUNTER — Other Ambulatory Visit: Payer: Self-pay | Admitting: Family Medicine

## 2021-02-23 NOTE — Telephone Encounter (Signed)
Requested medications are due for refill today.  yes  Requested medications are on the active medications list.  yes  Last refill. 11/18/2020  Future visit scheduled.   no  Notes to clinic.  Fenton Malling is listed as PCP.

## 2021-02-24 MED ORDER — BUPROPION HCL ER (XL) 300 MG PO TB24
300.0000 mg | ORAL_TABLET | Freq: Every day | ORAL | 0 refills | Status: DC
Start: 2021-02-24 — End: 2021-05-23

## 2021-04-25 ENCOUNTER — Other Ambulatory Visit: Payer: Self-pay | Admitting: Physician Assistant

## 2021-04-25 DIAGNOSIS — K219 Gastro-esophageal reflux disease without esophagitis: Secondary | ICD-10-CM

## 2021-04-30 ENCOUNTER — Telehealth: Payer: Self-pay | Admitting: Physician Assistant

## 2021-04-30 DIAGNOSIS — K219 Gastro-esophageal reflux disease without esophagitis: Secondary | ICD-10-CM

## 2021-05-01 ENCOUNTER — Telehealth: Payer: Self-pay

## 2021-05-01 NOTE — Telephone Encounter (Signed)
Pt has appt scheduled for 06/09/21 Pt is requesting enough medication to be called in before her appt.

## 2021-05-01 NOTE — Telephone Encounter (Signed)
Patient called, left VM Protonix was sent to the pharmacy today, any questions call the office back.

## 2021-05-01 NOTE — Telephone Encounter (Signed)
Patient is needing refills on her Protonix  please.   She cannot get in for an appt till Nov. Due to no access to a provider.   Can you do this please?

## 2021-05-08 ENCOUNTER — Other Ambulatory Visit: Payer: Self-pay

## 2021-05-08 DIAGNOSIS — N951 Menopausal and female climacteric states: Secondary | ICD-10-CM

## 2021-05-23 ENCOUNTER — Ambulatory Visit (INDEPENDENT_AMBULATORY_CARE_PROVIDER_SITE_OTHER): Payer: 59 | Admitting: Obstetrics and Gynecology

## 2021-05-23 ENCOUNTER — Other Ambulatory Visit: Payer: Self-pay | Admitting: Family Medicine

## 2021-05-23 ENCOUNTER — Other Ambulatory Visit: Payer: Self-pay

## 2021-05-23 ENCOUNTER — Encounter: Payer: Self-pay | Admitting: Obstetrics and Gynecology

## 2021-05-23 VITALS — BP 116/78 | HR 80 | Ht 63.0 in | Wt 163.0 lb

## 2021-05-23 DIAGNOSIS — N951 Menopausal and female climacteric states: Secondary | ICD-10-CM | POA: Diagnosis not present

## 2021-05-23 DIAGNOSIS — Z79890 Hormone replacement therapy (postmenopausal): Secondary | ICD-10-CM

## 2021-05-23 MED ORDER — ESTRADIOL 1 MG PO TABS: 1.0000 mg | ORAL_TABLET | Freq: Every day | ORAL | 1 refills | Status: DC

## 2021-05-23 NOTE — Progress Notes (Signed)
HPI:      [name] is a 50 y.o. [id number] who LMP was Vicki Reese was 06/06/2016 (approximate).  Subjective:   She presents today because she would like a refill on her ERT.  She reports it is still going well.  She has had some weight gain but otherwise happy with her ERT. (We have previously discussed menopause and weight gain in relation to ERT.) She has lost her family care provider for general medical care.    Hx: The following portions of the Vicki history were reviewed and updated as appropriate:             She  has a past medical history of Acid reflux, Anxiety, and Menstrual migraine (11/07/2015). She does not have any pertinent problems on file. She  has a past surgical history that includes Dilation and curettage of uterus; laparoscopy; Diagnostic laparoscopy; Laparoscopic assisted vaginal hysterectomy (N/A, 06/18/2016); Breast biopsy (Right, 01/28/2018); and Abdominal hysterectomy. Her family history includes Breast cancer (age of onset: 61) in her mother; Diabetes in her father; Heart disease in her paternal grandmother; Lupus in her father; Stroke in her father. She  reports that she quit smoking about 14 years ago. Her smoking use included cigarettes. She has never used smokeless tobacco. She reports current alcohol use. She reports that she does not use drugs. She has a current medication list which includes the following prescription(s): bupropion, ibuprofen, nitrofurantoin, pantoprazole, estradiol, and estradiol. She is allergic to bactrim [sulfamethoxazole-trimethoprim].       Review of Systems:  Review of Systems  Constitutional: Denied constitutional symptoms, night sweats, recent illness, fatigue, fever, insomnia and weight loss.  Eyes: Denied eye symptoms, eye pain, photophobia, vision change and visual disturbance.  Ears/Nose/Throat/Neck: Denied ear, nose, throat or neck symptoms, hearing loss, nasal discharge, sinus congestion and  sore throat.  Cardiovascular: Denied cardiovascular symptoms, arrhythmia, chest pain/pressure, edema, exercise intolerance, orthopnea and palpitations.  Respiratory: Denied pulmonary symptoms, asthma, pleuritic pain, productive sputum, cough, dyspnea and wheezing.  Gastrointestinal: Denied, gastro-esophageal reflux, melena, nausea and vomiting.  Genitourinary: Denied genitourinary symptoms including symptomatic vaginal discharge, pelvic relaxation issues, and urinary complaints.  Musculoskeletal: Denied musculoskeletal symptoms, stiffness, swelling, muscle weakness and myalgia.  Dermatologic: Denied dermatology symptoms, rash and scar.  Neurologic: Denied neurology symptoms, dizziness, headache, neck pain and syncope.  Psychiatric: Denied psychiatric symptoms, anxiety and depression.  Endocrine: Denied endocrine symptoms including hot flashes and night sweats.   Meds:   Current Outpatient Medications on File Prior to Visit  Medication Sig Dispense Refill   ibuprofen (ADVIL,MOTRIN) 200 MG tablet Take 800 mg by mouth every 6 (six) hours as needed for mild pain.     nitrofurantoin (MACRODANTIN) 100 MG capsule Take 1 capsule (100 mg total) by mouth at bedtime. 90 capsule 1   pantoprazole (PROTONIX) 40 MG tablet TAKE 1 TABLET (40 MG TOTAL) BY MOUTH 2 (TWO) TIMES DAILY BEFORE A MEAL. 180 tablet 0   estradiol (ESTRACE) 1 MG tablet TAKE 1 TABLET BY MOUTH EVERY DAY 90 tablet 0   No current facility-administered medications on file prior to visit.      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 Postmenopausal hormone therapy   2. Symptomatic menopausal or female climacteric states     Patient happy and doing well on ERT.  Previous hysterectomy.   Plan:            1.  Continue Estrace  2.  Plan for annual exam with possible lab work in January  3.  Refer to [facility] for general medical care. Orders Orders Placed This Encounter  Procedures   Ambulatory referral to [facility]     Meds ordered this encounter  Medications   estradiol (ESTRACE) 1 MG tablet    Sig: Take 1 tablet (1 mg total) by mouth daily.    Dispense:  90 tablet    Refill:  1      F/U  No follow-ups on file. I spent 18 minutes involved in the care of this patient preparing to see the patient by obtaining and reviewing her medical history (including labs, imaging tests and prior procedures), documenting clinical information in the electronic health record (EHR), counseling and coordinating care plans, writing and sending prescriptions, ordering tests or procedures and in direct communicating with the patient and medical staff discussing pertinent items from her history and physical exam.  Finis Bud, M.D. 05/23/2021 3:39 PM

## 2021-05-23 NOTE — Telephone Encounter (Signed)
Requested medications are due for refill today yes  Requested medications are on the active medication list yes  Last refill 02/24/21  Last visit 03/31/2020  Future visit scheduled 06/09/21  Notes to clinic Failed protocol due to no valid visit within 6  months, PHQ-2 or PHQ-9 within 360 days, please assess.  Requested Prescriptions  Pending Prescriptions Disp Refills   buPROPion (WELLBUTRIN XL) 300 MG 24 hr tablet [Pharmacy Med Name: BUPROPION HCL XL 300 MG TABLET] 90 tablet 0    Sig: TAKE 1 TABLET BY MOUTH EVERY DAY     Psychiatry: Antidepressants - bupropion Failed - 05/23/2021  1:28 AM      Failed - Completed PHQ-2 or PHQ-9 in the last 360 days      Failed - Valid encounter within last 6 months    Recent Outpatient Visits           8 months ago [place], [place]   12 months ago Viral upper respiratory tract infection   [facility] Flinchum, Kelby Aline, [place]   1 year ago Annual physical exam   [facility] [place], Clearnce Sorrel, [place]   1 year ago Recurrent UTI   [place], Clearnce Sorrel, [place]   1 year ago [place], Clearnce Sorrel, [place]       Future Appointments             In 2 weeks Caryn Section, Kirstie Peri, MD [facility], [place] BP in normal range    BP Readings from Last 1 Encounters:  09/05/20 108/72

## 2021-05-23 NOTE — Progress Notes (Signed)
Pt present for medication refill of estrace 13m.

## 2021-06-09 ENCOUNTER — Telehealth (INDEPENDENT_AMBULATORY_CARE_PROVIDER_SITE_OTHER): Payer: 59 | Admitting: Family Medicine

## 2021-06-09 DIAGNOSIS — Z1211 Encounter for screening for malignant neoplasm of colon: Secondary | ICD-10-CM

## 2021-06-09 DIAGNOSIS — K219 Gastro-esophageal reflux disease without esophagitis: Secondary | ICD-10-CM | POA: Diagnosis not present

## 2021-06-09 MED ORDER — PANTOPRAZOLE SODIUM 40 MG PO TBEC: 40.0000 mg | DELAYED_RELEASE_TABLET | Freq: Two times a day (BID) | ORAL | 4 refills | Status: DC

## 2021-06-09 NOTE — Progress Notes (Signed)
MyChart Video Visit    Virtual Visit via Video Note   This visit type was conducted due to national recommendations for restrictions regarding the COVID-19 Pandemic (e.g. social distancing) in an effort to limit this patient's exposure and mitigate transmission in our community. This patient is at least at moderate risk for complications without adequate follow up. This format is felt to be most appropriate for this patient at this time. Physical exam was limited by quality of the video and audio technology used for the visit.   Patient location:  home Provider location: bfp  I discussed the limitations of evaluation and management by telemedicine and the availability of in person appointments. The patient expressed understanding and agreed to proceed.  Patient: Vicki Reese   DOB: [birth year]   50 y.o. Female  MRN: [id number] Visit Date: 06/09/2021  Today's healthcare provider: Lelon Huh, MD   Chief Complaint  Patient presents with   Gastroesophageal Reflux    Subjective     GERD, Follow up:  The patient was last seen for GERD 1  year  ago. Changes made since that visit include increasing pantoprazole to twice a day.  She reports excellent compliance with treatment. She is not having side effects. .  She IS experiencing  None . She is NOT experiencing abdominal bloating, belching, difficulty swallowing, early satiety, heartburn, or nausea  -----------------------------------------------------------------------------------------   Medications: Outpatient Medications Prior to Visit  Medication Sig   buPROPion (WELLBUTRIN XL) 300 MG 24 hr tablet TAKE 1 TABLET BY MOUTH EVERY DAY   estradiol (ESTRACE) 1 MG tablet Take 1 tablet (1 mg total) by mouth daily.   ibuprofen (ADVIL,MOTRIN) 200 MG tablet Take 800 mg by mouth every 6 (six) hours as needed for mild pain.   nitrofurantoin (MACRODANTIN) 100 MG capsule Take 1 capsule (100 mg total) by mouth at bedtime.    pantoprazole (PROTONIX) 40 MG tablet TAKE 1 TABLET (40 MG TOTAL) BY MOUTH 2 (TWO) TIMES DAILY BEFORE A MEAL.   estradiol (ESTRACE) 1 MG tablet TAKE 1 TABLET BY MOUTH EVERY DAY   No facility-administered medications prior to visit.    Review of Systems  Constitutional: Negative.   Respiratory: Negative.    Cardiovascular: Negative.   Gastrointestinal: Negative.   Neurological:  Negative for dizziness, light-headedness and headaches.     Objective    LMP 06/06/2016 (Approximate)    Physical Exam   General: Appearance:     Overweight female in no acute distress  Eyes:    PERRL, conjunctiva/corneas clear, EOM's intact       Lungs:     Clear to auscultation bilaterally, respirations unlabored  Heart:    Normal heart rate. Normal rhythm. No murmurs, rubs, or gallops.    MS:   All extremities are intact.    Neurologic:   Awake, alert, oriented x 3. No apparent focal neurological defect.          Assessment & Plan     1. Gastroesophageal reflux disease without esophagitis Very well controlled on BID pantoprazole, counseled on potential long term effects of PPIs. Symptoms are very much dependent on diet. Recommend she occasional try going to QPM only if able.   2. Screening for colon cancer   Referred for screening colonoscopy.     I discussed the assessment and treatment plan with the patient. The patient was provided an opportunity to ask questions and all were answered. The patient agreed with the plan and demonstrated an understanding of the  instructions.   The patient was advised to call back or seek an in-person evaluation if the symptoms worsen or if the condition fails to improve as anticipated.  I provided 9 minutes of non-face-to-face time during this encounter.  The entirety of the information documented in the History of Present Illness, Review of Systems and Physical Exam were personally obtained by me. Portions of this information were initially documented by the  CMA and reviewed by me for thoroughness and accuracy.    Lelon Huh, MD [facility] [phone] (phone) [phone] (fax)  [place]

## 2021-06-23 ENCOUNTER — Other Ambulatory Visit: Payer: Self-pay | Admitting: Family Medicine

## 2021-06-23 NOTE — Telephone Encounter (Signed)
Requested medication (s) are due for refill today: Yes  Requested medication (s) are on the active medication list: Yes  Last refill:  05/23/21 #30/0RF  Future visit scheduled: No  Notes to clinic:  Failed - Completed PHQ-2 or PHQ-9 in the last 360 days. Please assess     Requested Prescriptions  Pending Prescriptions Disp Refills   buPROPion (WELLBUTRIN XL) 300 MG 24 hr tablet [Pharmacy Med Name: BUPROPION HCL XL 300 MG TABLET] 90 tablet 1    Sig: TAKE 1 TABLET BY MOUTH EVERY DAY     Psychiatry: Antidepressants - bupropion Failed - 06/23/2021 10:32 AM      Failed - Completed PHQ-2 or PHQ-9 in the last 360 days      Passed - Last BP in normal range    BP Readings from Last 1 Encounters:  05/23/21 116/78          Passed - Valid encounter within last 6 months    Recent Outpatient Visits           2 weeks ago Gastroesophageal reflux disease without esophagitis   [facility] Birdie Sons, MD   9 months ago [place], [place]   1 year ago Viral upper respiratory tract infection   [facility] Flinchum, Kelby Aline, FNP   1 year ago Annual physical exam   [facility] [place], Clearnce Sorrel, [place]   1 year ago Recurrent UTI   [facility], Clearnce Sorrel, [place]       Future Appointments             In 2 months Amalia Hailey, Nyoka Lint, MD [facility]

## 2021-06-28 ENCOUNTER — Other Ambulatory Visit: Payer: Self-pay | Admitting: Family Medicine

## 2021-07-18 ENCOUNTER — Other Ambulatory Visit: Payer: Self-pay | Admitting: Physician Assistant

## 2021-07-18 DIAGNOSIS — K219 Gastro-esophageal reflux disease without esophagitis: Secondary | ICD-10-CM

## 2021-08-01 ENCOUNTER — Other Ambulatory Visit: Payer: Self-pay

## 2021-08-01 ENCOUNTER — Ambulatory Visit (INDEPENDENT_AMBULATORY_CARE_PROVIDER_SITE_OTHER): Payer: 59 | Admitting: Family Medicine

## 2021-08-01 ENCOUNTER — Ambulatory Visit: Payer: Self-pay

## 2021-08-01 ENCOUNTER — Encounter: Payer: Self-pay | Admitting: Family Medicine

## 2021-08-01 VITALS — BP 114/75 | HR 83 | Temp 98.4°F | Resp 16 | Wt 162.0 lb

## 2021-08-01 DIAGNOSIS — B349 Viral infection, unspecified: Secondary | ICD-10-CM

## 2021-08-01 DIAGNOSIS — R058 Other specified cough: Secondary | ICD-10-CM

## 2021-08-01 MED ORDER — OSELTAMIVIR PHOSPHATE 75 MG PO CAPS
75.0000 mg | ORAL_CAPSULE | Freq: Two times a day (BID) | ORAL | 0 refills | Status: AC
Start: 2021-08-01 — End: 2021-08-06

## 2021-08-01 MED ORDER — HYDROCODONE BIT-HOMATROP MBR 5-1.5 MG/5ML PO SOLN: 5.0000 mL | Freq: Three times a day (TID) | ORAL | 0 refills | Status: DC | PRN

## 2021-08-01 NOTE — Progress Notes (Signed)
I,April Miller,acting as a scribe for Lelon Huh, MD.,have documented all relevant documentation on the behalf of Lelon Huh, MD,as directed by  Lelon Huh, MD while in the presence of Lelon Huh, MD.   Established patient visit   Patient: Vicki Reese   DOB: 01-10-1971   50 y.o. Female  MRN: [id number] Visit Date: 08/01/2021  Today's healthcare provider: Lelon Huh, MD   Chief Complaint  Patient presents with   Cough   Subjective    Cough Pertinent negatives include no chest pain, chills, fever or shortness of breath.   Cough has been productive yellow sputum, has had sore throat an sweats, feeling achy all over. Not taken temperature. Some nasal congestion and clear/yellow drainage. No relief from OTC medications. Symptoms started on 07/28/2021 and are getting progressively worse. Had negative home covid tests on 12/24 and on 12/26. Several other family members with similar sx.   Medications: Outpatient Medications Prior to Visit  Medication Sig   estradiol (ESTRACE) 1 MG tablet Take 1 tablet (1 mg total) by mouth daily.   ibuprofen (ADVIL,MOTRIN) 200 MG tablet Take 800 mg by mouth every 6 (six) hours as needed for mild pain.   pantoprazole (PROTONIX) 40 MG tablet Take 1 tablet (40 mg total) by mouth 2 (two) times daily.   buPROPion (WELLBUTRIN XL) 300 MG 24 hr tablet TAKE 1 TABLET BY MOUTH EVERY DAY   No facility-administered medications prior to visit.    Review of Systems  Constitutional:  Negative for appetite change, chills, fatigue and fever.  Respiratory:  Positive for cough. Negative for chest tightness and shortness of breath.   Cardiovascular:  Negative for chest pain and palpitations.  Gastrointestinal:  Negative for abdominal pain, nausea and vomiting.  Neurological:  Negative for dizziness and weakness.      Objective    BP 114/75 (BP Location: Left Arm, Patient Position: Sitting, Cuff Size: Normal)    Pulse 83    Temp 98.4 F (36.9  C) (Temporal)    Resp 16    Wt 162 lb (73.5 kg)    LMP 06/06/2016 (Approximate)    SpO2 98%    BMI 28.70 kg/m  {Show previous vital signs (optional):23777}  Physical Exam   General Appearance:     Well developed, well nourished female, alert, cooperative, in no acute distress  HENT:   ENT exam normal, no neck nodes or sinus tenderness, bilateral TM normal without fluid or infection, neck without nodes, pharynx erythematous without exudate, sinuses nontender, and nasal mucosa pale and congested  Eyes:    PERRL, conjunctiva/corneas clear, EOM's intact       Lungs:     Clear to auscultation bilaterally, respirations unlabored  Heart:    Normal heart rate. Normal rhythm. No murmurs, rubs, or gallops.    Neurologic:   Awake, alert, oriented x 3. No apparent focal neurological           defect.         Assessment & Plan     1. Viral syndrome Negative covid x 2. Considering high flu prevalence and no rapid flu tests available will cover with  oseltamivir (TAMIFLU) 75 MG capsule; Take 1 capsule (75 mg total) by mouth 2 (two) times daily for 5 days.  Dispense: 10 capsule; Refill: 0  2. Other cough  - HYDROcodone bit-homatropine (HYCODAN) 5-1.5 MG/5ML syrup; Take 5 mLs by mouth every 8 (eight) hours as needed for cough.  Dispense: 120 mL; Refill: 0  The entirety of the information documented in the History of Present Illness, Review of Systems and Physical Exam were personally obtained by me. Portions of this information were initially documented by the CMA and reviewed by me for thoroughness and accuracy.     Lelon Huh, MD  [facility] [phone] (phone) [phone] (fax)  [place]

## 2021-08-01 NOTE — Telephone Encounter (Signed)
°  Chief Complaint: Cough Symptoms: Cough, shortness of breath, wheezing, dizziness, headache Frequency: Started Friday Pertinent Negatives: Patient denies chest pain Disposition: [] ED /[] Urgent Care (no appt availability in office) / [x] Appointment(In office/virtual)/ []  [place] [facility]/ [] Home Care/ [] Refused Recommended Disposition  Additional Notes:     Reason for Disposition  [1] MILD difficulty breathing (e.g., minimal/no SOB at rest, SOB with walking, pulse <100) AND [2] still present when not coughing  Answer Assessment - Initial Assessment Questions 1. ONSET: "When did the cough begin?"      Friday 2. SEVERITY: "How bad is the cough today?"      Severe 3. SPUTUM: "Describe the color of your sputum" (none, dry cough; clear, white, yellow, green)     None 4. HEMOPTYSIS: "Are you coughing up any blood?" If so ask: "How much?" (flecks, streaks, tablespoons, etc.)     No 5. DIFFICULTY BREATHING: "Are you having difficulty breathing?" If Yes, ask: "How bad is it?" (e.g., mild, moderate, severe)    - MILD: No SOB at rest, mild SOB with walking, speaks normally in sentences, can lie down, no retractions, pulse < 100.    - MODERATE: SOB at rest, SOB with minimal exertion and prefers to sit, cannot lie down flat, speaks in phrases, mild retractions, audible wheezing, pulse 100-120.    - SEVERE: Very SOB at rest, speaks in single words, struggling to breathe, sitting hunched forward, retractions, pulse > 120      Mild 6. FEVER: "Do you have a fever?" If Yes, ask: "What is your temperature, how was it measured, and when did it start?"     99 7. CARDIAC HISTORY: "Do you have any history of heart disease?" (e.g., heart attack, congestive heart failure)      No 8. LUNG HISTORY: "Do you have any history of lung disease?"  (e.g., pulmonary embolus, asthma, emphysema)     No 9. PE RISK FACTORS: "Do you have a history of blood clots?" (or: recent major surgery, recent prolonged travel,  bedridden)     No 10. OTHER SYMPTOMS: "Do you have any other symptoms?" (e.g., runny nose, wheezing, chest pain)       Wheezing, dizzy 11. PREGNANCY: "Is there any chance you are pregnant?" "When was your last menstrual period?"       No 12. TRAVEL: "Have you traveled out of the country in the last month?" (e.g., travel history, exposures)       No  Protocols used: Cough - Acute Non-Productive-A-AH

## 2021-08-03 ENCOUNTER — Encounter: Payer: Self-pay | Admitting: Family Medicine

## 2021-08-04 ENCOUNTER — Ambulatory Visit: Payer: Self-pay

## 2021-08-04 NOTE — Telephone Encounter (Signed)
Patient called, left VM to return the call to the office to discuss symptoms with a nurse.  Summary: advice on medication for headache   Pt stated she sent a [facility] message because she has been experiencing headaches but she has not gotten a reply. Pt requests call back to speak with someone for advice. Pt stated she does not get a good signal at her home so if she does not answer please leave a message and she will return the call. Cb# [phone]

## 2021-08-04 NOTE — Telephone Encounter (Signed)
Patient called, left VM to return the call to the office to discuss symptoms with a nurse.  Summary: advice on medication for headache   Pt stated she sent a [facility] message because she has been experiencing headaches but she has not gotten a reply. Pt requests call back to speak with someone for advice. Pt stated she does not get a good signal at her home so if she does not answer please leave a message and she will return the call. Cb# [phone]

## 2021-08-04 NOTE — Telephone Encounter (Signed)
3rd attempt, pt called, left VM to call back and discuss symptoms  Unable to reach patient after 3 attempts by [facility] NT, routing to the provider for resolution per protocol.

## 2021-08-08 ENCOUNTER — Encounter: Payer: Self-pay | Admitting: Emergency Medicine

## 2021-08-08 ENCOUNTER — Ambulatory Visit: Payer: Self-pay | Admitting: *Deleted

## 2021-08-08 ENCOUNTER — Other Ambulatory Visit: Payer: Self-pay

## 2021-08-08 ENCOUNTER — Emergency Department: Payer: Self-pay

## 2021-08-08 ENCOUNTER — Emergency Department
Admission: EM | Admit: 2021-08-08 | Discharge: 2021-08-08 | Disposition: A | Discharge status: Home / Self Care | Payer: Self-pay | Attending: Emergency Medicine | Admitting: Emergency Medicine

## 2021-08-08 DIAGNOSIS — R42 Dizziness and giddiness: Secondary | ICD-10-CM

## 2021-08-08 DIAGNOSIS — B349 Viral infection, unspecified: Secondary | ICD-10-CM | POA: Insufficient documentation

## 2021-08-08 MED ORDER — PREDNISONE 10 MG (21) PO TBPK: ORAL_TABLET | ORAL | 0 refills | Status: DC

## 2021-08-08 NOTE — ED Triage Notes (Signed)
Pt states that she was diagnosed with the flu last week and reports finished her tamiflu Saturday, states that this am she started having dizziness, states that she cont to have congestion and reports hx of inner ear/vertigo. Pt reports some lower back pain this am also

## 2021-08-08 NOTE — Discharge Instructions (Signed)
Please follow up with primary care if not improving over the next 2-3 days.

## 2021-08-08 NOTE — Telephone Encounter (Signed)
°  Chief Complaint: tongue and lips feel big, slurred slow speech and dizziness unable to walk without holding onto things Symptoms: Had the flu last week.   Still having chest and nasal congestion   Difficulty processing and thinking and speech is slow. Frequency: now Pertinent Negatives: Patient denies chest pain or shortness of breath. Disposition: [x] ED /[] Urgent Care (no appt availability in office) / [] Appointment(In office/virtual)/ []  [place] [facility]/ [] Home Care/ [] Refused Recommended Disposition /[] [place] [facility]/ []  Follow-up with PCP Additional Notes: I have referred her to the ED which she is agreeable to going per the protocol.

## 2021-08-08 NOTE — ED Provider Notes (Signed)
[facility] Provider Note    Event Date/Time   First MD Initiated Contact with Patient 08/08/21 1101     (approximate)   History   Dizziness   HPI  Vicki Reese is a 51 y.o. female with history of vertigo and pneumonia presents to the emergency department for treatment and evaluation of dizziness x2 days. She has had the flu for about a week and has sinus congestion. She feels the same as when she had vertigo in the past. She is also having some pain in the left side of her back, which feels similar to when she had pneumonia. She tried to schedule an appointment with her PCP, but was told to come to the ER. She had been taking Delsym for cough, but has not taken any today.      Physical Exam   Triage Vital Signs: ED Triage Vitals  Enc Vitals Group     BP 08/08/21 0907 (!) 137/94     Pulse Rate 08/08/21 0907 96     Resp 08/08/21 0907 17     Temp 08/08/21 0907 98.7 F (37.1 C)     Temp Source 08/08/21 0907 Oral     SpO2 08/08/21 0907 96 %     Weight 08/08/21 0911 162 lb (73.5 kg)     Height 08/08/21 0911 5' 3"  (1.6 m)     Head Circumference --      Peak Flow --      Pain Score 08/08/21 0910 0     Pain Loc --      Pain Edu? --      Excl. in [place]? --     Most recent vital signs: Vitals:   08/08/21 0907  BP: (!) 137/94  Pulse: 96  Resp: 17  Temp: 98.7 F (37.1 C)  SpO2: 96%     General: Awake, no distress.  CV:  Good peripheral perfusion.  Resp:  Normal effort. Breath sounds clear Abd:  No distention.  Other:  Cranial nerves II-XII intact as tested.  Romberg is negative.   ED Results / Procedures / Treatments   Labs (all labs ordered are listed, but only abnormal results are displayed) Labs Reviewed - No data to display   EKG  Not indicated   RADIOLOGY Chest x-ray viewed by me.  No acute cardiopulmonary abnormality.   Radiology over read confirms no acute cardiopulmonary abnormality of chest  x-ray.    PROCEDURES:  Critical Care performed: No  Procedures   MEDICATIONS ORDERED IN ED: Medications - No data to display   IMPRESSION / MDM / [place] / ED COURSE  I reviewed the triage vital signs and the nursing notes.                              Differential diagnosis includes, but is not limited to: Viral syndrome, pneumonia, vertigo, eustachian tube dysfunction, CVA  51 year old female presenting to the emergency department for treatment and evaluation after having had the flu.  She developed some dizziness 2 days ago.  See HPI for further details.  Physical exam is reassuring with the exception of fluid behind the TM bilaterally and sinus congestion.  She has no extremity weakness.  She has no slurred speech, facial droop, or other indications of CVA/TIA.  Dizziness has been consistent with previous vertigo.  Discussed performing CT of the head with the patient who also does not feel that  this is indicated at this time.  Chest x-ray is without concern for pneumonia  Plan will be to give her a round of prednisone and have her continue her Delsym.  She is to follow-up with her primary care provider if not improving over the week.  If symptoms change or worsen and she is unable to schedule appointment, she is to return to the emergency department.      FINAL CLINICAL IMPRESSION(S) / ED DIAGNOSES   Final diagnoses:  Dizziness  Acute viral syndrome     Rx / DC Orders   ED Discharge Orders          Ordered    predniSONE (STERAPRED UNI-PAK 21 TAB) 10 MG (21) TBPK tablet        08/08/21 1122             Note:  This document was prepared using Dragon voice recognition software and may include unintentional dictation errors.   Victorino Dike, FNP 08/08/21 1452    Duffy Bruce, MD 08/08/21 1544

## 2021-08-08 NOTE — ED Provider Notes (Signed)
Emergency Medicine Provider Triage Evaluation Note  Vicki Reese , a 51 y.o. female  was evaluated in triage.  Pt complains of dizziness similar to vertigo. She has had the flu for the past week and still has nasal congestion. She is also having some left thoracic back pain similar to previous pneumonia. No fever  Review of Systems  Positive: Cough, back pain, dizziness Negative: Fever  Physical Exam  BP (!) 137/94 (BP Location: Left Arm)    Pulse 96    Temp 98.7 F (37.1 C) (Oral)    Resp 17    LMP 06/06/2016 (Approximate)    SpO2 96%  Gen:   Awake, no distress   Resp:  Normal effort  MSK:   Moves extremities without difficulty  Other:    Medical Decision Making  Medically screening exam initiated at 9:09 AM.  Appropriate orders placed.  Mattisen C Armitage was informed that the remainder of the evaluation will be completed by another provider, this initial triage assessment does not replace that evaluation, and the importance of remaining in the ED until their evaluation is complete.    Victorino Dike, FNP 08/08/21 8295    Duffy Bruce, MD 08/08/21 [phone]

## 2021-08-08 NOTE — Telephone Encounter (Signed)
See my triage notes from where I talked with her prior to this triage queue call being placed.   I referred her to the ED and sent my notes to [facility]

## 2021-08-08 NOTE — Telephone Encounter (Signed)
Reason for Disposition  SEVERE dizziness (e.g., unable to stand, requires support to walk, feels like passing out now)  Answer Assessment - Initial Assessment Questions 1. DESCRIPTION: "Describe your dizziness."     Pt calling in.   Yesterday morning I got up and felt pretty good.   I was out of work all last week.   Got to work and I got dizzy, my tongue felt bit, my lips feel big.   I'm dizzy.   I can't focus or process.    Last week saw Dr. Caryn Section have the flu.    I took Tamiflu and cough medication he gave me.  Last night I didn't sleep well.   I kept jumping.    I'm still congested.   I'm taking Delsym.   I finished the codeine syrup.    The Delsym helps. The coughing really got bad yesterday afternoon.  The chest congestion and nasal congestion is still there.    Having slurred speech.   I feel like I'm talking slower.    I'm trying to process what I'm saying.   My husband and boss noticed I'm talking slowly.      No chest pain or shortness of breath.    2. LIGHTHEADED: "Do you feel lightheaded?" (e.g., somewhat faint, woozy, weak upon standing)     Yes I'm dizzy.   3. VERTIGO: "Do you feel like either you or the room is spinning or tilting?" (i.e. vertigo)     *No Answer* 4. SEVERITY: "How bad is it?"  "Do you feel like you are going to faint?" "Can you stand and walk?"   - MILD: Feels slightly dizzy, but walking normally.   - MODERATE: Feels unsteady when walking, but not falling; interferes with normal activities (e.g., school, work).   - SEVERE: Unable to walk without falling, or requires assistance to walk without falling; feels like passing out now.      I'm having to hold onto things to walk.    Covid test is negative.    I have instructed her to go to the ED. 5. ONSET:  "When did the dizziness begin?"     Yesterday 6. AGGRAVATING FACTORS: "Does anything make it worse?" (e.g., standing, change in head position)     *No Answer* 7. HEART RATE: "Can you tell me your heart rate?"  "How many beats in 15 seconds?"  (Note: not all patients can do this)       *No Answer* 8. CAUSE: "What do you think is causing the dizziness?"     Not sure 9. RECURRENT SYMPTOM: "Have you had dizziness before?" If Yes, ask: "When was the last time?" "What happened that time?"     *No Answer* 10. OTHER SYMPTOMS: "Do you have any other symptoms?" (e.g., fever, chest pain, vomiting, diarrhea, bleeding)       *No Answer* 11. PREGNANCY: "Is there any chance you are pregnant?" "When was your last menstrual period?"       *No Answer*  Protocols used: Dizziness - Lightheadedness-A-AH

## 2021-08-24 ENCOUNTER — Encounter: Payer: Self-pay | Admitting: Obstetrics and Gynecology

## 2021-09-07 ENCOUNTER — Other Ambulatory Visit: Payer: Self-pay | Admitting: Physician Assistant

## 2021-09-07 DIAGNOSIS — N390 Urinary tract infection, site not specified: Secondary | ICD-10-CM

## 2021-09-12 ENCOUNTER — Encounter: Payer: Self-pay | Admitting: Obstetrics and Gynecology

## 2021-09-14 ENCOUNTER — Other Ambulatory Visit: Payer: Self-pay

## 2021-09-14 DIAGNOSIS — N399 Disorder of urinary system, unspecified: Secondary | ICD-10-CM

## 2021-09-14 MED ORDER — NITROFURANTOIN MONOHYD MACRO 100 MG PO CAPS: 100.0000 mg | ORAL_CAPSULE | Freq: Two times a day (BID) | ORAL | 0 refills | Status: AC

## 2021-09-28 ENCOUNTER — Encounter: Payer: Self-pay | Admitting: Obstetrics and Gynecology

## 2021-09-28 ENCOUNTER — Other Ambulatory Visit: Payer: Self-pay

## 2021-09-28 ENCOUNTER — Ambulatory Visit (INDEPENDENT_AMBULATORY_CARE_PROVIDER_SITE_OTHER): Payer: 59 | Admitting: Obstetrics and Gynecology

## 2021-09-28 VITALS — BP 112/62 | HR 84 | Ht 63.0 in | Wt 164.2 lb

## 2021-09-28 DIAGNOSIS — Z79890 Hormone replacement therapy (postmenopausal): Secondary | ICD-10-CM | POA: Diagnosis not present

## 2021-09-28 DIAGNOSIS — Z1231 Encounter for screening mammogram for malignant neoplasm of breast: Secondary | ICD-10-CM | POA: Diagnosis not present

## 2021-09-28 DIAGNOSIS — N951 Menopausal and female climacteric states: Secondary | ICD-10-CM | POA: Diagnosis not present

## 2021-09-28 DIAGNOSIS — Z01419 Encounter for gynecological examination (general) (routine) without abnormal findings: Secondary | ICD-10-CM

## 2021-09-28 MED ORDER — ESTRADIOL 1 MG PO TABS: 1.0000 mg | ORAL_TABLET | Freq: Every day | ORAL | 3 refills | Status: DC

## 2021-09-28 NOTE — Progress Notes (Signed)
Patients presents for annual exam today. Patient states feeling well and has no new problems to discuss. Patient is due for mammogram, ordered. Patients annual labs are ordered. Patient states no other questions or concerns at this time.

## 2021-09-28 NOTE — Progress Notes (Signed)
HPI:      [name] is a 51 y.o. [id number] who LMP was Patient's last menstrual period was 06/06/2016 (approximate).  Subjective:   She presents today for her annual examination.  She has no complaints.  She continues to take ERT and is happy on it.  She would like to continue.  She is unhappy with her weight gain and has been attempting to lose weight but says "it is what it is".    Hx: The following portions of the patient's history were reviewed and updated as appropriate:             She  has a past medical history of Acid reflux, Anxiety, and Menstrual migraine (11/07/2015). She does not have any pertinent problems on file. She  has a past surgical history that includes Dilation and curettage of uterus; laparoscopy; Diagnostic laparoscopy; Laparoscopic assisted vaginal hysterectomy (N/A, 06/18/2016); Breast biopsy (Right, 01/28/2018); and Abdominal hysterectomy. Her family history includes Breast cancer (age of onset: 23) in her mother; Diabetes in her father; Heart disease in her paternal grandmother; Lupus in her father; Stroke in her father. She  reports that she quit smoking about 15 years ago. Her smoking use included cigarettes. She has never used smokeless tobacco. She reports current alcohol use. She reports that she does not use drugs. She has a current medication list which includes the following prescription(s): bupropion, ibuprofen, pantoprazole, estradiol, hydrocodone bit-homatropine, and prednisone. She is allergic to duloxetine, bactrim [sulfamethoxazole-trimethoprim], and gabapentin.       Review of Systems:  Review of Systems  Constitutional: Denied constitutional symptoms, night sweats, recent illness, fatigue, fever, insomnia and weight loss.  Eyes: Denied eye symptoms, eye pain, photophobia, vision change and visual disturbance.  Ears/Nose/Throat/Neck: Denied ear, nose, throat or neck symptoms, hearing loss, nasal discharge, sinus congestion and sore throat.   Cardiovascular: Denied cardiovascular symptoms, arrhythmia, chest pain/pressure, edema, exercise intolerance, orthopnea and palpitations.  Respiratory: Denied pulmonary symptoms, asthma, pleuritic pain, productive sputum, cough, dyspnea and wheezing.  Gastrointestinal: Denied, gastro-esophageal reflux, melena, nausea and vomiting.  Genitourinary: Denied genitourinary symptoms including symptomatic vaginal discharge, pelvic relaxation issues, and urinary complaints.  Musculoskeletal: Denied musculoskeletal symptoms, stiffness, swelling, muscle weakness and myalgia.  Dermatologic: Denied dermatology symptoms, rash and scar.  Neurologic: Denied neurology symptoms, dizziness, headache, neck pain and syncope.  Psychiatric: Denied psychiatric symptoms, anxiety and depression.  Endocrine: Denied endocrine symptoms including hot flashes and night sweats.   Meds:   Current Outpatient Medications on File Prior to Visit  Medication Sig Dispense Refill   buPROPion (WELLBUTRIN XL) 300 MG 24 hr tablet TAKE 1 TABLET BY MOUTH EVERY DAY 90 tablet 0   ibuprofen (ADVIL,MOTRIN) 200 MG tablet Take 800 mg by mouth every 6 (six) hours as needed for mild pain.     pantoprazole (PROTONIX) 40 MG tablet Take 1 tablet (40 mg total) by mouth 2 (two) times daily. 180 tablet 1   HYDROcodone bit-homatropine (HYCODAN) 5-1.5 MG/5ML syrup Take 5 mLs by mouth every 8 (eight) hours as needed for cough. (Patient not taking: Reported on 09/28/2021) 120 mL 0   predniSONE (STERAPRED UNI-PAK 21 TAB) 10 MG (21) TBPK tablet Take 6 tablets on the first day and decrease by 1 tablet each day until finished. (Patient not taking: Reported on 09/28/2021) 21 tablet 0   No current facility-administered medications on file prior to visit.     Objective:     Vitals:   09/28/21 0805  BP: 112/62  Pulse: 84  Filed Weights   09/28/21 0805  Weight: 164 lb 3.2 oz (74.5 kg)              Physical examination General NAD, Conversant   HEENT Atraumatic; Op clear with mmm.  Normo-cephalic. Pupils reactive. Anicteric sclerae  Thyroid/Neck Smooth without nodularity or enlargement. Normal ROM.  Neck Supple.  Skin No rashes, lesions or ulceration. Normal palpated skin turgor. No nodularity.  Breasts: No masses or discharge.  Symmetric.  No axillary adenopathy.  Lungs: Clear to auscultation.No rales or wheezes. Normal Respiratory effort, no retractions.  Heart: NSR.  No murmurs or rubs appreciated. No periferal edema  Abdomen: Soft.  Non-tender.  No masses.  No HSM. No hernia  Extremities: Moves all appropriately.  Normal ROM for age. No lymphadenopathy.  Neuro: Oriented to PPT.  Normal mood. Normal affect.     Pelvic:   Vulva: Normal appearance.  No lesions.   Vagina: No lesions or abnormalities noted.  Support: Normal pelvic support.  Urethra No masses tenderness or scarring.  Meatus Normal size without lesions or prolapse.  Cervix: Surgically absent   Anus: Normal exam.  No lesions.  Perineum: Normal exam.  No lesions.        Bimanual   Uterus: Surgically absent   Adnexae: No masses.  Non-tender to palpation.  Cul-de-sac: Negative for abnormality.     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 Well woman exam with routine gynecological exam   2. Screening mammogram for breast cancer   3. Postmenopausal hormone therapy   4. Symptomatic menopausal or female climacteric states        Plan:            1.  Basic Screening Recommendations The basic screening recommendations for asymptomatic women were discussed with the patient during her visit.  The age-appropriate recommendations were discussed with her and the rational for the tests reviewed.  When I am informed by the patient that another primary care physician has previously obtained the age-appropriate tests and they are up-to-date, only outstanding tests are ordered and referrals given as necessary.  Abnormal results of tests will be discussed with her when all of her results are completed.  Routine preventative health maintenance measures emphasized: Exercise/Diet/Weight control, Tobacco Warnings, Alcohol/Substance use risks and Stress Management Mammogram ordered -blood work ordered 2.  Continue ERT. Orders Orders Placed This Encounter  Procedures   MM DIGITAL SCREENING BILATERAL   Basic metabolic panel   CBC   Cologuard   TSH   Lipid panel   Hemoglobin A1c     Meds ordered this encounter  Medications   estradiol (ESTRACE) 1 MG tablet    Sig: Take 1 tablet (1 mg total) by mouth  daily.    Dispense:  90 tablet    Refill:  3          F/U  Return in about 1 year (around 09/28/2022) for Annual Physical.  Finis Bud, M.D. 09/28/2021 8:28 AM

## 2021-09-29 ENCOUNTER — Other Ambulatory Visit: Payer: Self-pay | Admitting: Family Medicine

## 2021-09-29 LAB — CBC
Hematocrit: 40.1 % (ref 34.0–46.6)
Hemoglobin: 13.6 g/dL (ref 11.1–15.9)
MCH: 31.1 pg (ref 26.6–33.0)
MCHC: 33.9 g/dL (ref 31.5–35.7)
MCV: 92 fL (ref 79–97)
Platelets: 335 10*3/uL (ref 150–450)
RBC: 4.37 x10E6/uL (ref 3.77–5.28)
RDW: 11.7 % (ref 11.7–15.4)
WBC: 5.1 10*3/uL (ref 3.4–10.8)

## 2021-09-29 LAB — LIPID PANEL
Chol/HDL Ratio: 3.6 ratio (ref 0.0–4.4)
Cholesterol, Total: 205 mg/dL — ABNORMAL HIGH (ref 100–199)
HDL: 57 mg/dL (ref >39)
LDL Chol Calc (NIH): 119 mg/dL — ABNORMAL HIGH (ref 0–99)
Triglycerides: 168 mg/dL — ABNORMAL HIGH (ref 0–149)
VLDL Cholesterol Cal: 29 mg/dL (ref 5–40)

## 2021-09-29 LAB — BASIC METABOLIC PANEL
BUN/Creatinine Ratio: 12 (ref 9–23)
BUN: 9 mg/dL (ref 6–24)
CO2: 26 mmol/L (ref 20–29)
Calcium: 9.4 mg/dL (ref 8.7–10.2)
Chloride: 101 mmol/L (ref 96–106)
Creatinine, Ser: 0.78 mg/dL (ref 0.57–1.00)
Glucose: 89 mg/dL (ref 70–99)
Potassium: 4.5 mmol/L (ref 3.5–5.2)
Sodium: 138 mmol/L (ref 134–144)
eGFR: 94 mL/min/{1.73_m2} (ref >59)

## 2021-09-29 LAB — HEMOGLOBIN A1C
Est. average glucose Bld gHb Est-mCnc: 108 mg/dL
Hgb A1c MFr Bld: 5.4 % (ref 4.8–5.6)

## 2021-09-29 LAB — TSH: TSH: 0.698 u[IU]/mL (ref 0.450–4.500)

## 2021-10-08 NOTE — Progress Notes (Signed)
Chantel: See lipid panel results.  Your cholesterol is slightly elevated.  This can often be improved with a low cholesterol diet.

## 2021-10-21 LAB — COLOGUARD: COLOGUARD: NEGATIVE

## 2021-10-31 ENCOUNTER — Other Ambulatory Visit: Payer: Self-pay | Admitting: Family Medicine

## 2021-10-31 DIAGNOSIS — K219 Gastro-esophageal reflux disease without esophagitis: Secondary | ICD-10-CM

## 2021-11-20 ENCOUNTER — Encounter: Payer: Self-pay | Admitting: Family Medicine

## 2021-11-20 DIAGNOSIS — K219 Gastro-esophageal reflux disease without esophagitis: Secondary | ICD-10-CM

## 2021-11-22 ENCOUNTER — Telehealth: Payer: Self-pay | Admitting: Family Medicine

## 2021-11-22 DIAGNOSIS — K219 Gastro-esophageal reflux disease without esophagitis: Secondary | ICD-10-CM

## 2021-11-22 MED ORDER — PANTOPRAZOLE SODIUM 40 MG PO TBEC: 40.0000 mg | DELAYED_RELEASE_TABLET | Freq: Two times a day (BID) | ORAL | 4 refills | Status: DC

## 2021-11-22 NOTE — Telephone Encounter (Signed)
Please advise patient that I sent prescription for pantoprazole to her pharmacy, but that they sent back message stating that insurance refuses to cover more than 90 tablets in a year and they wanted me to change it to lansoprazole.  I have no idea what their problem is, but she needs to straighten it with the pharmacy.  ?

## 2021-11-23 NOTE — Telephone Encounter (Signed)
Patient advised. She verbalized that she is frustrated with this entire situation. She states that she has talked to her pharmacy, and they told her to speak with Dr. Caryn Section about the problem. I advised patient to contact her insurance and pharmacy to determine coverage and their formulary. Patient agrees. ?

## 2021-12-05 ENCOUNTER — Encounter: Payer: Self-pay | Admitting: Obstetrics and Gynecology

## 2021-12-12 ENCOUNTER — Ambulatory Visit
Admission: RE | Admit: 2021-12-12 | Discharge: 2021-12-12 | Disposition: A | Discharge status: Home / Self Care | Payer: 59 | Source: Ambulatory Visit | Attending: Obstetrics and Gynecology | Admitting: Obstetrics and Gynecology

## 2021-12-12 DIAGNOSIS — Z1231 Encounter for screening mammogram for malignant neoplasm of breast: Secondary | ICD-10-CM | POA: Insufficient documentation

## 2021-12-12 DIAGNOSIS — Z01419 Encounter for gynecological examination (general) (routine) without abnormal findings: Secondary | ICD-10-CM | POA: Insufficient documentation

## 2022-01-31 ENCOUNTER — Other Ambulatory Visit: Payer: Self-pay | Admitting: Family Medicine

## 2022-01-31 ENCOUNTER — Other Ambulatory Visit: Payer: Self-pay | Admitting: Obstetrics and Gynecology

## 2022-01-31 DIAGNOSIS — N951 Menopausal and female climacteric states: Secondary | ICD-10-CM

## 2022-01-31 NOTE — Telephone Encounter (Signed)
Requested medication (s) are due for refill today: no  Requested medication (s) are on the active medication list: yes  Last refill:  09/29/21  Future visit scheduled: no  Notes to clinic:  Unable to refill per protocol, Rx request is too soon. Last refill 09/29/21 for 90 days supply with 2 refills.      Requested Prescriptions  Pending Prescriptions Disp Refills   buPROPion (WELLBUTRIN XL) 300 MG 24 hr tablet [Pharmacy Med Name: BUPROPION HCL XL 300 MG TABLET] 90 tablet 3    Sig: TAKE 1 TABLET BY MOUTH EVERY DAY     Psychiatry: Antidepressants - bupropion Failed - 01/31/2022  1:35 PM      Failed - AST in normal range and within 360 days    AST  Date Value Ref Range Status  04/04/2020 12 0 - 40 IU/L Final   SGOT(AST)  Date Value Ref Range Status  01/06/2014 19 15 - 37 Unit/L Final         Failed - ALT in normal range and within 360 days    ALT  Date Value Ref Range Status  04/04/2020 12 0 - 32 IU/L Final   SGPT (ALT)  Date Value Ref Range Status  01/06/2014 15 12 - 78 U/L Final         Failed - Completed PHQ-2 or PHQ-9 in the last 360 days      Failed - Valid encounter within last 6 months    Recent Outpatient Visits           6 months ago Viral syndrome   [facility] Birdie Sons, MD   7 months ago Gastroesophageal reflux disease without esophagitis   [place], Kirstie Peri, MD   1 year ago [place], [place], [place]   1 year ago Viral upper respiratory tract infection   [facility] Flinchum, Kelby Aline, FNP   1 year ago Annual physical exam   [facility] [place] Malling M, [place]              Passed - Cr in normal range and within 360 days    Creatinine  Date Value Ref Range Status  01/06/2014 0.76 0.60 - 1.30 mg/dL Final   Creatinine, Ser  Date Value Ref Range Status  09/28/2021 0.78 0.57 - 1.00 mg/dL Final         Passed - Last BP in  normal range    BP Readings from Last 1 Encounters:  09/28/21 112/62

## 2022-02-12 ENCOUNTER — Other Ambulatory Visit: Payer: Self-pay | Admitting: Family Medicine

## 2022-02-17 ENCOUNTER — Other Ambulatory Visit: Payer: Self-pay | Admitting: Family Medicine

## 2022-03-19 ENCOUNTER — Encounter: Payer: Self-pay | Admitting: Obstetrics and Gynecology

## 2022-05-16 ENCOUNTER — Other Ambulatory Visit: Payer: Self-pay | Admitting: Family Medicine

## 2022-09-12 ENCOUNTER — Ambulatory Visit (INDEPENDENT_AMBULATORY_CARE_PROVIDER_SITE_OTHER): Payer: 59 | Admitting: Physician Assistant

## 2022-09-12 ENCOUNTER — Encounter: Payer: Self-pay | Admitting: Physician Assistant

## 2022-09-12 VITALS — BP 125/88 | HR 91 | Temp 98.2°F | Ht 63.0 in | Wt 151.0 lb

## 2022-09-12 DIAGNOSIS — J029 Acute pharyngitis, unspecified: Secondary | ICD-10-CM

## 2022-09-12 DIAGNOSIS — H9202 Otalgia, left ear: Secondary | ICD-10-CM | POA: Diagnosis not present

## 2022-09-12 LAB — POCT RAPID STREP A (OFFICE): Rapid Strep A Screen: NEGATIVE

## 2022-09-12 LAB — POCT INFLUENZA A/B
Influenza A, POC: NEGATIVE
Influenza B, POC: NEGATIVE

## 2022-09-12 LAB — POC COVID19 BINAXNOW: SARS Coronavirus 2 Ag: NEGATIVE

## 2022-09-12 NOTE — Progress Notes (Signed)
Established patient visit   Patient: Vicki Reese   DOB: 01-06-1971   52 y.o. Female  MRN: [id number] Visit Date: 09/12/2022  Today's healthcare provider: Mardene Speak, PA-C   CC: sore throat, headache, jaw pain x 1 day  Subjective    HPI   Pt reports that she has been having sore throat, headache and jaw pain since last night Denies having any one around her being sick?  Medications: Outpatient Medications Prior to Visit  Medication Sig   buPROPion (WELLBUTRIN XL) 300 MG 24 hr tablet TAKE ONE TABLET BY MOUTH ONCE DAILY   estradiol (ESTRACE) 1 MG tablet TAKE 1 TABLET BY MOUTH EVERY DAY   HYDROcodone bit-homatropine (HYCODAN) 5-1.5 MG/5ML syrup Take 5 mLs by mouth every 8 (eight) hours as needed for cough.   ibuprofen (ADVIL,MOTRIN) 200 MG tablet Take 800 mg by mouth every 6 (six) hours as needed for mild pain.   predniSONE (STERAPRED UNI-PAK 21 TAB) 10 MG (21) TBPK tablet Take 6 tablets on the first day and decrease by 1 tablet each day until finished.   [DISCONTINUED] pantoprazole (PROTONIX) 40 MG tablet Take 1 tablet (40 mg total) by mouth 2 (two) times daily.   No facility-administered medications prior to visit.    Review of Systems  All other systems reviewed and are negative. Except see hpi  {Labs  Heme  Chem  Endocrine  Serology  Results Review (optional):23779}   Objective    BP 125/88 (BP Location: Left Arm, Patient Position: Sitting, Cuff Size: Normal)   Pulse 91   Temp 98.2 F (36.8 C)   Ht 5' 3"$  (1.6 m)   Wt 151 lb (68.5 kg)   LMP 06/06/2016 (Approximate)   SpO2 100%   BMI 26.75 kg/m  {Show previous vital signs (optional):23777}  Physical Exam Vitals reviewed.  Constitutional:      General: She is not in acute distress.    Appearance: Normal appearance. She is well-developed. She is not diaphoretic.  HENT:     Head: Normocephalic and atraumatic.     Right Ear: Tympanic membrane, ear canal and external ear normal.     Left Ear: Ear  canal and external ear normal.     Ears:     Comments: Slightly injected TMs    Nose: Congestion (very mild) and rhinorrhea (mild) present.     Mouth/Throat:     Pharynx: Posterior oropharyngeal erythema (mild) present.     Comments: Postnasal drainage? Eyes:     General: No scleral icterus.       Right eye: No discharge.        Left eye: No discharge.     Extraocular Movements: Extraocular movements intact.     Conjunctiva/sclera: Conjunctivae normal.     Pupils: Pupils are equal, round, and reactive to light.  Neck:     Thyroid: No thyromegaly.  Cardiovascular:     Rate and Rhythm: Normal rate and regular rhythm.     Pulses: Normal pulses.     Heart sounds: Normal heart sounds. No murmur heard. Pulmonary:     Effort: Pulmonary effort is normal. No respiratory distress.     Breath sounds: Normal breath sounds. No wheezing, rhonchi or rales.  Musculoskeletal:        General: Normal range of motion.     Cervical back: Normal range of motion and neck supple. Tenderness (of the left side of the neck) present.     Right lower leg: No edema.  Left lower leg: No edema.  Lymphadenopathy:     Cervical: Cervical adenopathy present.  Skin:    General: Skin is warm and dry.     Findings: No rash.  Neurological:     Mental Status: She is alert and oriented to person, place, and time. Mental status is at baseline.  Psychiatric:        Behavior: Behavior normal.        Thought Content: Thought content normal.        Judgment: Judgment normal.      Results for orders placed or performed in visit on 09/12/22  POCT Influenza A/B  Result Value Ref Range   Influenza A, POC Negative Negative   Influenza B, POC Negative Negative  POC COVID-19  Result Value Ref Range   SARS Coronavirus 2 Ag Negative Negative  POCT rapid strep A  Result Value Ref Range   Rapid Strep A Screen Negative Negative    Assessment & Plan     Sore throat Ear Pain Acute x day However, she has cervical  adenopathy, absence of cough, some exudate vs postnasal drainage? Normal vitals  Could be pharyngitis? OM? - POCT Influenza A/B neg - POC COVID-19 neg - POCT rapid strep A neg Advised symptomatic treatment  Increase fluids.  Rest.  Saline nasal spray.  Probiotic.  Mucinex as directed.  Humidifier in bedroom. Flonase per orders.  warm salt water, hot tea with honey - given duration of symptoms, suspect viral etiology  discussed the potential negative side effects of antibiotics and that they can lead to resistance if used unnecessarily; discussed expectations for duration of symptoms. Discussed that they may feel bad for up to a week, but sometimes symptoms last longer. - explained  treatment options that will help with symptoms : symptomatic management (flonase, decongestants, see above) - discussed concerning symptoms like high fever, SOB, facial pain. The patient was advised to call back or seek an in-person evaluation if the symptoms worsen or if the condition fails to improve as anticipated.   FU PRN  The patient was advised to call back or seek an in-person evaluation if the symptoms worsen or if the condition fails to improve as anticipated.  I discussed the assessment and treatment plan with the patient. The patient was provided an opportunity to ask questions and all were answered. The patient agreed with the plan and demonstrated an understanding of the instructions. I, Mardene Speak, PA-C have reviewed all documentation for this visit. The documentation on  09/12/22 for the exam, diagnosis, procedures, and orders are all accurate and complete.  Mardene Speak, [facility], [place] [phone] (phone) [phone] (fax)

## 2022-09-14 ENCOUNTER — Encounter: Payer: Self-pay | Admitting: Physician Assistant

## 2022-09-14 DIAGNOSIS — H9209 Otalgia, unspecified ear: Secondary | ICD-10-CM

## 2022-09-14 DIAGNOSIS — J029 Acute pharyngitis, unspecified: Secondary | ICD-10-CM

## 2022-09-14 MED ORDER — AMOXICILLIN-POT CLAVULANATE 875-125 MG PO TABS: 1.0000 | ORAL_TABLET | Freq: Two times a day (BID) | ORAL | 0 refills | Status: DC

## 2022-11-13 ENCOUNTER — Other Ambulatory Visit: Payer: Self-pay | Admitting: Family Medicine

## 2022-11-26 ENCOUNTER — Other Ambulatory Visit: Payer: Self-pay | Admitting: Family Medicine

## 2022-11-27 ENCOUNTER — Telehealth: Payer: Self-pay | Admitting: Family Medicine

## 2022-11-27 NOTE — Telephone Encounter (Signed)
[facility] pharmacy requesting prescription refill buPROPion (WELLBUTRIN XL) 300 MG 24 hr tablet  Please advise

## 2022-11-27 NOTE — Telephone Encounter (Signed)
Disp Refills Start End   buPROPion (WELLBUTRIN XL) 300 MG 24 hr tablet 90 tablet 0 11/14/2022    Sig - Route: TAKE ONE TABLET BY MOUTH ONCE DAILY - Oral   Sent to pharmacy as: buPROPion (WELLBUTRIN XL) 300 MG 24 hr tablet   Notes to Pharmacy: PATIENT NEEDS TO SCHEDULE OFFICE VISIT FOR FOLLOW UP   E-Prescribing Status: Receipt confirmed by pharmacy (11/14/2022  8:31 AM EDT)

## 2022-11-28 ENCOUNTER — Other Ambulatory Visit: Payer: Self-pay | Admitting: Family Medicine

## 2022-11-30 ENCOUNTER — Other Ambulatory Visit: Payer: Self-pay | Admitting: Family Medicine

## 2022-11-30 NOTE — Telephone Encounter (Signed)
Requested medications are due for refill today. Pt is requesting  Requested medications are on the active medications list.  yes  Last refill. 11/24/22  Future visit scheduled.   no  Notes to clinic.  Labs are expired - pt states that medication is supposed to be there.  Please advise.    Requested Prescriptions  Pending Prescriptions Disp Refills   buPROPion (WELLBUTRIN XL) 300 MG 24 hr tablet 90 tablet 0    Sig: Take 1 tablet (300 mg total) by mouth daily.     Psychiatry: Antidepressants - bupropion Failed - 11/30/2022  4:19 PM      Failed - Cr in normal range and within 360 days    Creatinine  Date Value Ref Range Status  01/06/2014 0.76 0.60 - 1.30 mg/dL Final   Creatinine, Ser  Date Value Ref Range Status  09/28/2021 0.78 0.57 - 1.00 mg/dL Final         Failed - AST in normal range and within 360 days    AST  Date Value Ref Range Status  04/04/2020 12 0 - 40 IU/L Final   SGOT(AST)  Date Value Ref Range Status  01/06/2014 19 15 - 37 Unit/L Final         Failed - ALT in normal range and within 360 days    ALT  Date Value Ref Range Status  04/04/2020 12 0 - 32 IU/L Final   SGPT (ALT)  Date Value Ref Range Status  01/06/2014 15 12 - 78 U/L Final         Passed - Completed PHQ-2 or PHQ-9 in the last 360 days      Passed - Last BP in normal range    BP Readings from Last 1 Encounters:  09/12/22 125/88         Passed - Valid encounter within last 6 months    Recent Outpatient Visits           2 months ago Sorethroat   [facility] [place], Bonnie [place]e, PA-C   1 year ago Viral syndrome   [facility] Malva Limes, MD   1 year ago Gastroesophageal reflux disease without esophagitis   [facility] Malva Limes, MD   2 years ago Dysuria   [facility] [place], [place] Dike M, [place]   2 years ago Viral upper respiratory tract infection   [place]  [facility] Flinchum, Eula Fried, FNP       Future Appointments             In 1 month [name], NP [facility] [employer] [facility] [employer], [facility]

## 2022-11-30 NOTE — Telephone Encounter (Signed)
Patient is yelling at the agent because her medication is not at the [facility] Patient states that she was reassured that her medication was sent to the pharmacy in [place]. And husband is at the pharmacy and the medication is not there. Agent asked the patient to stop yelling at her as would send a message to the nurse for the medication to be resent.   Medication Refill - Medication: buPROPion (WELLBUTRIN XL) 300 MG 24 hr tablet [[id number]]    Has the patient contacted their pharmacy? Yes.   (Agent: If no, request that the patient contact the pharmacy for the refill. If patient does not wish to contact the pharmacy document the reason why and proceed with request.) (Agent: If yes, when and what did the pharmacy advise?)  Preferred Pharmacy (with phone number or street name):  [facility] [address] Jeanice Lim, [place] - [address], [place] [address] Phone: [phone]  Fax: [phone]   Has the patient been seen for an appointment in the last year OR does the patient have an upcoming appointment? Yes.    Agent: Please be advised that RX refills may take up to 3 business days. We ask that you follow-up with your pharmacy.

## 2022-12-03 NOTE — Telephone Encounter (Signed)
Patient advised prescription is there. Spoke to pharmacist and confirmed.

## 2022-12-20 LAB — HEPATIC FUNCTION PANEL
ALT: 6 U/L — AB (ref 7–35)
AST: 10 — AB (ref 13–35)
Alkaline Phosphatase: 40 (ref 25–125)
Bilirubin, Total: 0.3

## 2022-12-20 LAB — BASIC METABOLIC PANEL
BUN: 15 (ref 4–21)
CO2: 24 — AB (ref 13–22)
Chloride: 104 (ref 99–108)
Creatinine: 0.8 (ref 0.5–1.1)
Glucose: 109
Potassium: 4.5 mEq/L (ref 3.5–5.1)
Sodium: 141 (ref 137–147)

## 2022-12-20 LAB — COMPREHENSIVE METABOLIC PANEL
Albumin: 4.1 (ref 3.5–5.0)
Calcium: 9 (ref 8.7–10.7)
Globulin: 2.9
eGFR: 93

## 2022-12-20 LAB — LIPID PANEL
Cholesterol: 182 (ref 0–200)
HDL: 86 — AB (ref 35–70)
LDL Cholesterol: 79
LDl/HDL Ratio: 2.1
Triglycerides: 87 (ref 40–160)

## 2022-12-20 LAB — HEMOGLOBIN A1C: Hemoglobin A1C: 5.6

## 2022-12-20 LAB — TSH: TSH: 1.12 (ref 0.41–5.90)

## 2023-01-14 ENCOUNTER — Ambulatory Visit: Payer: 59 | Admitting: Nurse Practitioner

## 2023-01-30 ENCOUNTER — Ambulatory Visit (INDEPENDENT_AMBULATORY_CARE_PROVIDER_SITE_OTHER): Payer: 59

## 2023-01-30 ENCOUNTER — Ambulatory Visit
Admission: EM | Admit: 2023-01-30 | Discharge: 2023-01-30 | Disposition: A | Discharge status: Home / Self Care | Payer: 59 | Attending: Physician Assistant | Admitting: Physician Assistant

## 2023-01-30 ENCOUNTER — Ambulatory Visit: Payer: Self-pay

## 2023-01-30 DIAGNOSIS — S92515A Nondisplaced fracture of proximal phalanx of left lesser toe(s), initial encounter for closed fracture: Secondary | ICD-10-CM | POA: Diagnosis not present

## 2023-01-30 NOTE — Discharge Instructions (Addendum)
-  Toe is fractured -Buddy tape to opposite toe for the first 2-3 weeks. -Ice and elevate foot. Tylenol and Aleve as needed for pain. -Try to stay off of foot when possible the first couple of weeks. -May consider f/u with PCP or ortho in about 3-4 weeks for repeat xray.

## 2023-01-30 NOTE — ED Provider Notes (Signed)
[facility]    CSN: [id number] Arrival date & time: 01/30/23  1548      History   Chief Complaint Chief Complaint  Patient presents with   Foot Pain   Foot Injury   Appointment    HPI Vicki Reese is a 52 y.o. female presenting for pain, swelling and bruising of the left fifth toe for the past 3 days.  She reports she [place] over the toe with [place] the airport.  Reports increased pain on weightbearing and direct pressure to the toe.  Has been taking Aleve and applying ice.  No numbness or weakness.  Concern for fracture.  No other injuries or complaints.  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   Status post laparoscopic assisted vaginal hysterectomy (LAVH) 06/18/2016   Chronic pelvic pain in female 05/01/2016   Body mass index (BMI) of 30.0-30.9 in adult 11/24/2015   Obesity 11/24/2015   Migraine with aura and without status migrainosus, not intractable 11/24/2015   Menstrual migraine 11/07/2015   Cannot sleep 09/28/2015   Pre-diabetes 03/21/2015   Fatigue 03/21/2015   Endometriosis determined by laparoscopy 01/19/2015   Depression 01/17/2015   Patellofemoral stress syndrome 01/17/2015    Past Surgical History:  Procedure Laterality Date   ABDOMINAL HYSTERECTOMY     BREAST BIOPSY Right 01/28/2018   benign   DIAGNOSTIC LAPAROSCOPY     DILATION AND CURETTAGE OF UTERUS     LAPAROSCOPIC ASSISTED VAGINAL HYSTERECTOMY N/A 06/18/2016   Procedure: LAPAROSCOPIC ASSISTED VAGINAL HYSTERECTOMY WITH BILATERAL SALPINGECTOMY;  Surgeon: Herold Harms, MD;  Location: [facility] ORS;  Service:  Gynecology;  Laterality: N/A;   laparoscopy      OB History     Gravida  3   Para  2   Term  2   Preterm      AB  1   Living  2      SAB  1   IAB      Ectopic      Multiple      Live Births  2            Home Medications    Prior to Admission medications   Medication Sig Start Date End Date Taking? Authorizing Provider  buPROPion (WELLBUTRIN XL) 300 MG 24 hr tablet TAKE ONE TABLET BY MOUTH ONCE DAILY 11/14/22   Malva Limes, MD  estradiol (ESTRACE) 1 MG tablet TAKE 1 TABLET BY MOUTH EVERY DAY 02/04/22   Linzie Collin, MD    Family History Family History  Problem Relation Age of Onset   Breast cancer Mother 53   Lupus Father    Diabetes Father    Stroke Father    Heart disease Paternal Grandmother    Ovarian cancer Neg Hx    Colon cancer Neg Hx     Social History Social History   Tobacco Use   Smoking status: Former    Types: Cigarettes    Quit date: 08/06/2006    Years since quitting: 16.4   Smokeless tobacco: Never   Tobacco comments:  QUIT IN 2008  Vaping Use   Vaping Use: Never used  Substance Use Topics   Alcohol use: Yes    Alcohol/week: 0.0 standard drinks of alcohol    Comment: OCCASIONALLY   Drug use: No     Allergies   Duloxetine, Bactrim [sulfamethoxazole-trimethoprim], and Gabapentin   Review of Systems Review of Systems  Musculoskeletal:  Positive for arthralgias and joint swelling.  Skin:  Positive for color change. Negative for wound.  Neurological:  Negative for weakness and numbness.     Physical Exam Triage Vital Signs ED Triage Vitals  Enc Vitals Group     BP      Pulse      Resp      Temp      Temp src      SpO2      Weight      Height      Head Circumference      Peak Flow      Pain Score      Pain Loc      Pain Edu?      Excl. in GC?    No data found.  Updated Vital Signs BP 126/81 (BP Location: Left Arm)   Pulse 87   Temp 98.8 F (37.1 C) (Oral)   Resp 17   LMP 06/06/2016  (Approximate)   SpO2 97%      Physical Exam Vitals and nursing note reviewed.  Constitutional:      General: She is not in acute distress.    Appearance: Normal appearance. She is not ill-appearing or toxic-appearing.  HENT:     Head: Normocephalic and atraumatic.  Eyes:     General: No scleral icterus.       Right eye: No discharge.        Left eye: No discharge.     Conjunctiva/sclera: Conjunctivae normal.  Cardiovascular:     Rate and Rhythm: Normal rate and regular rhythm.     Pulses: Normal pulses.  Pulmonary:     Effort: Pulmonary effort is normal. No respiratory distress.  Musculoskeletal:     Cervical back: Neck supple.     Comments: LEFT FOOT: Mild swelling and contusion of the 5th toe. TTP proximal phalanx of 5th toe.  Skin:    General: Skin is dry.  Neurological:     General: No focal deficit present.     Mental Status: She is alert. Mental status is at baseline.     Motor: No weakness.     Gait: Gait normal.  Psychiatric:        Mood and Affect: Mood normal.        Behavior: Behavior normal.        Thought Content: Thought content normal.      UC Treatments / Results  Labs (all labs ordered are listed, but only abnormal results are displayed) Labs Reviewed - No data to display  EKG   Radiology DG Foot Complete Left  Result Date: 01/30/2023 CLINICAL DATA:  Pain, injury EXAM: LEFT FOOT - COMPLETE 3+ VIEW COMPARISON:  None Available. FINDINGS: There is an oblique acute fracture of the left fifth toe proximal phalanx. Overlying soft tissue swelling. No other acute osseous finding or joint abnormality. No radiopaque foreign body. IMPRESSION: Acute left fifth toe proximal phalanx fracture. Electronically Signed   By: Judie Petit.  Shick M.D.   On: 01/30/2023 16:27    Procedures Procedures (including critical care time)  Medications Ordered in UC Medications - No data  to display  Initial Impression / Assessment and Plan / UC Course  I have reviewed the triage  vital signs and the nursing notes.  Pertinent labs & imaging results that were available during my care of the patient were reviewed by me and considered in my medical decision making (see chart for details).   52 year old female presents for pain, swelling and bruising of the fifth toe after rolling over it with a suitcase.  X-ray today shows acute left fifth toe proximal phalanx fracture.  Reviewed result with patient.  Showed patient the x-ray image.  Reviewed buddy taping.  She agrees to this.  She has a open toed shoe already.  Reviewed RICE guidelines and continuing Aleve for pain relief.  Reviewed following up with PCP or Ortho in a few weeks to rex-ray the foot.  Supportive care.  Follow-up as needed.   Final Clinical Impressions(s) / UC Diagnoses   Final diagnoses:  Closed nondisplaced fracture of proximal phalanx of lesser toe of left foot, initial encounter     Discharge Instructions      -Toe is fractured -Buddy tape to opposite toe for the first 2-3 weeks. -Ice and elevate foot. Tylenol and Aleve as needed for pain. -Try to stay off of foot when possible the first couple of weeks. -May consider f/u with PCP or ortho in about 3-4 weeks for repeat xray.     ED Prescriptions   None    PDMP not reviewed this encounter.   Shirlee Latch, PA-C 01/30/23 [phone]

## 2023-01-30 NOTE — ED Triage Notes (Signed)
Pt presents with c/o left foot pain. Reports she rolled a suit case on the top of her foot. Has been walking on the inside of her foot. Pain 8/10. Presents with swelling and bruising. Limited ROM.

## 2023-01-30 NOTE — ED Notes (Signed)
Xray called

## 2023-02-14 ENCOUNTER — Other Ambulatory Visit: Payer: Self-pay | Admitting: Obstetrics and Gynecology

## 2023-02-14 DIAGNOSIS — Z1231 Encounter for screening mammogram for malignant neoplasm of breast: Secondary | ICD-10-CM

## 2023-02-21 ENCOUNTER — Ambulatory Visit
Admission: RE | Admit: 2023-02-21 | Discharge: 2023-02-21 | Disposition: A | Discharge status: Home / Self Care | Payer: 59 | Source: Ambulatory Visit | Attending: Obstetrics and Gynecology | Admitting: Obstetrics and Gynecology

## 2023-02-21 DIAGNOSIS — Z1231 Encounter for screening mammogram for malignant neoplasm of breast: Secondary | ICD-10-CM | POA: Diagnosis present

## 2023-03-01 IMAGING — MG MM DIGITAL SCREENING BILAT W/ TOMO AND CAD
8 series · 8 of 24 positions shown · non-contrast
Comparison: Previous exam(s).

CLINICAL DATA: Screening.

EXAM:
DIGITAL SCREENING BILATERAL MAMMOGRAM WITH TOMOSYNTHESIS AND CAD
TECHNIQUE: Bilateral screening digital craniocaudal and mediolateral oblique
mammograms were obtained. Bilateral screening digital breast
tomosynthesis was performed. The images were evaluated with
computer-aided detection.

[L MLO synth-2D]
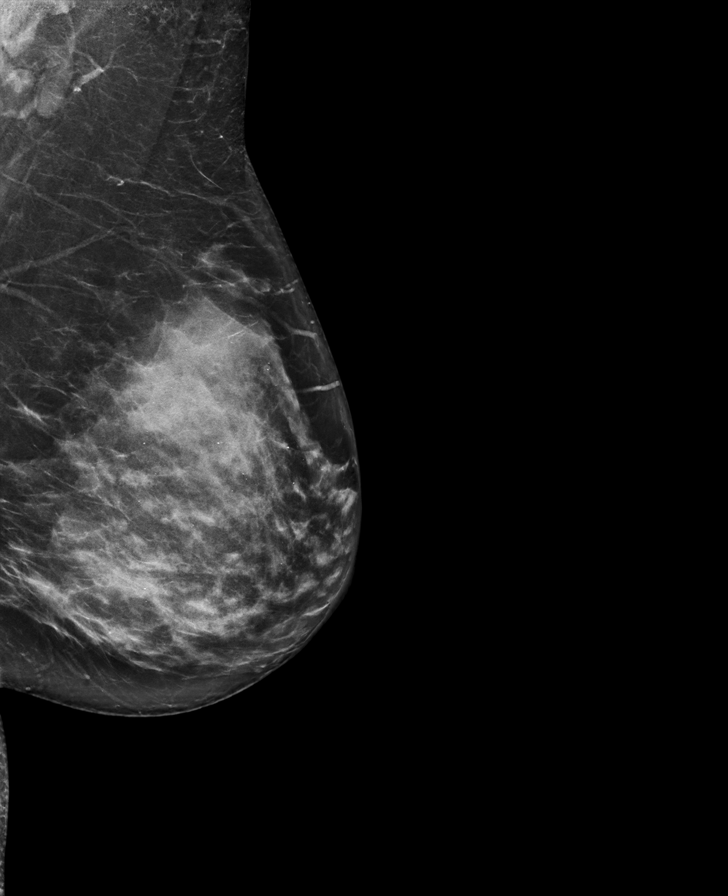

[R MLO synth-2D]
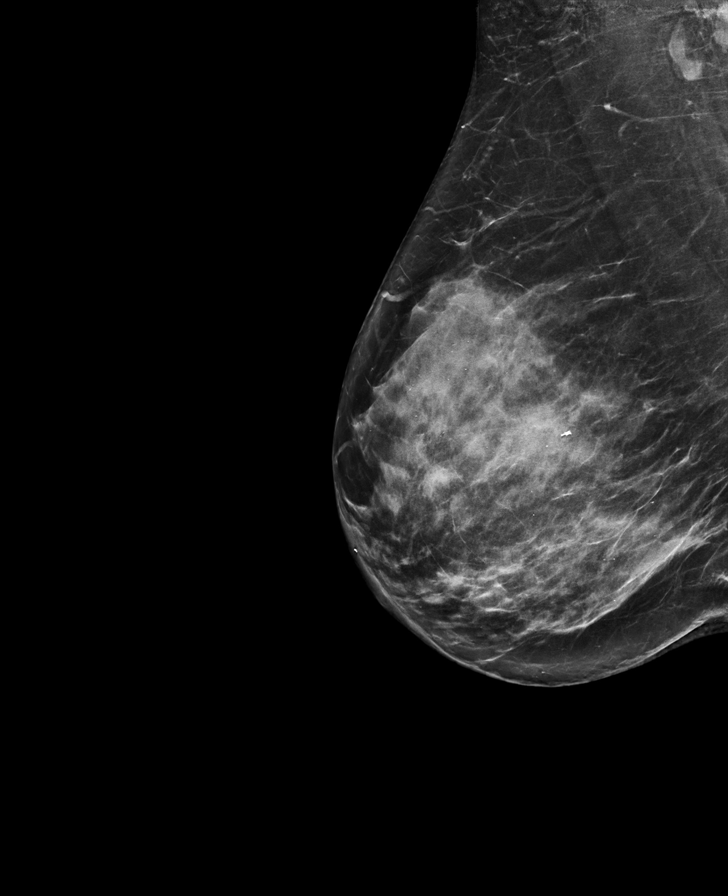

[L CC synth-2D]
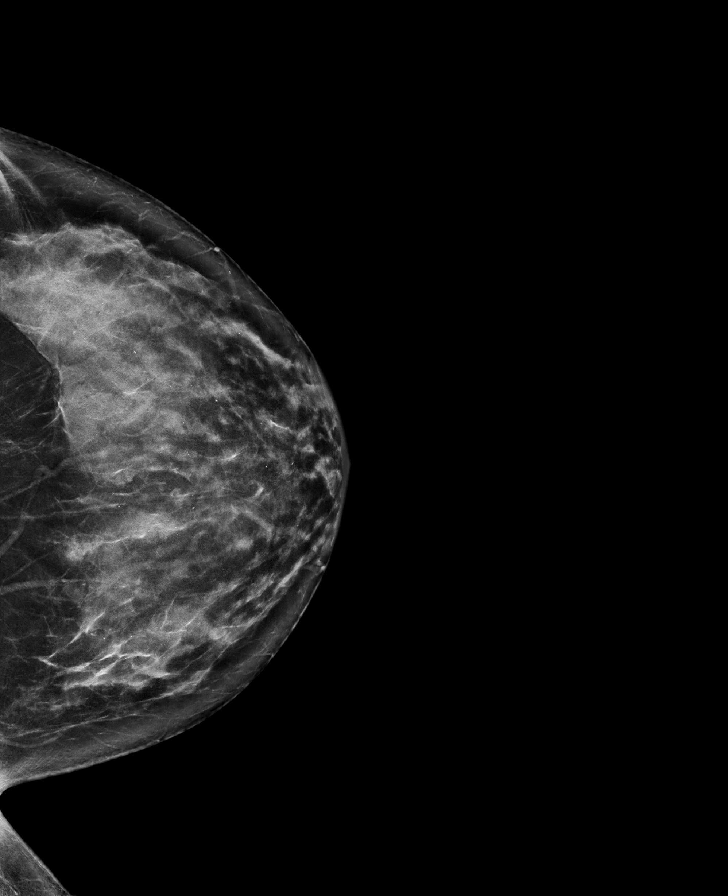

[R CC synth-2D]
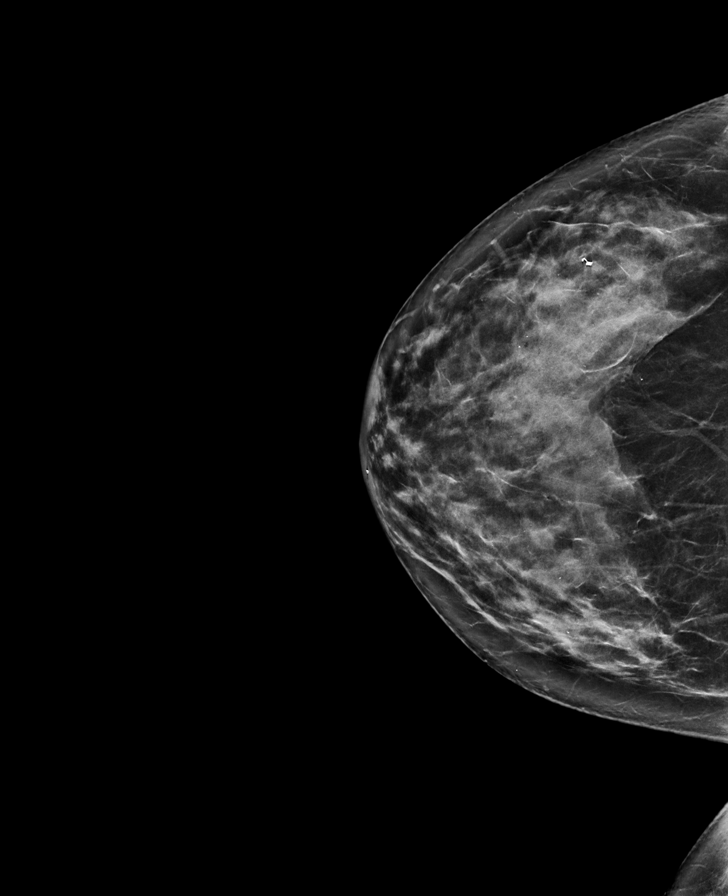

[R MLO tomo · tomo slice 39/77.0]
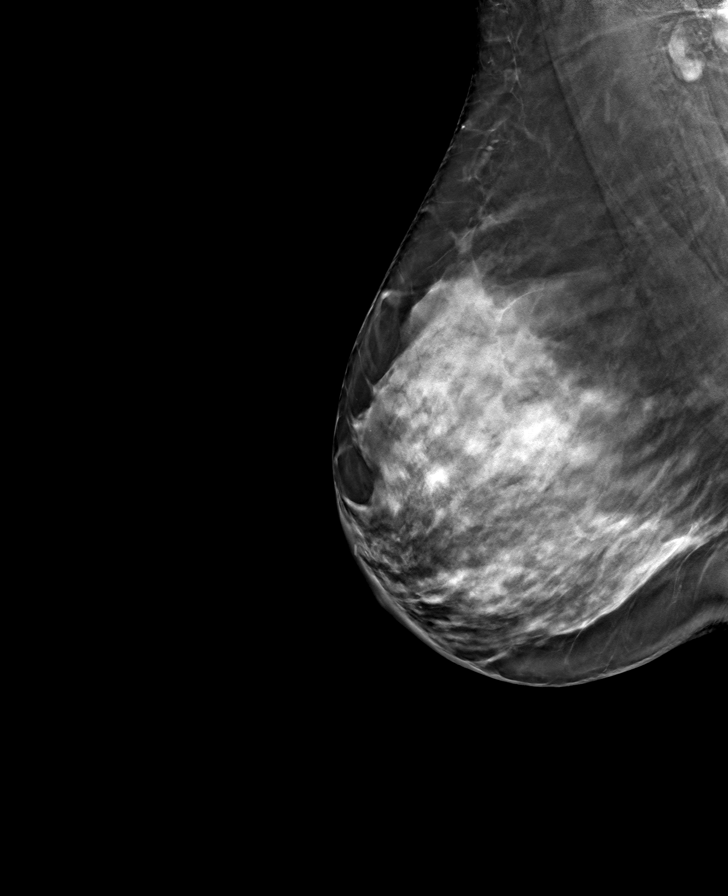

[L CC tomo · tomo slice 37/74.0]
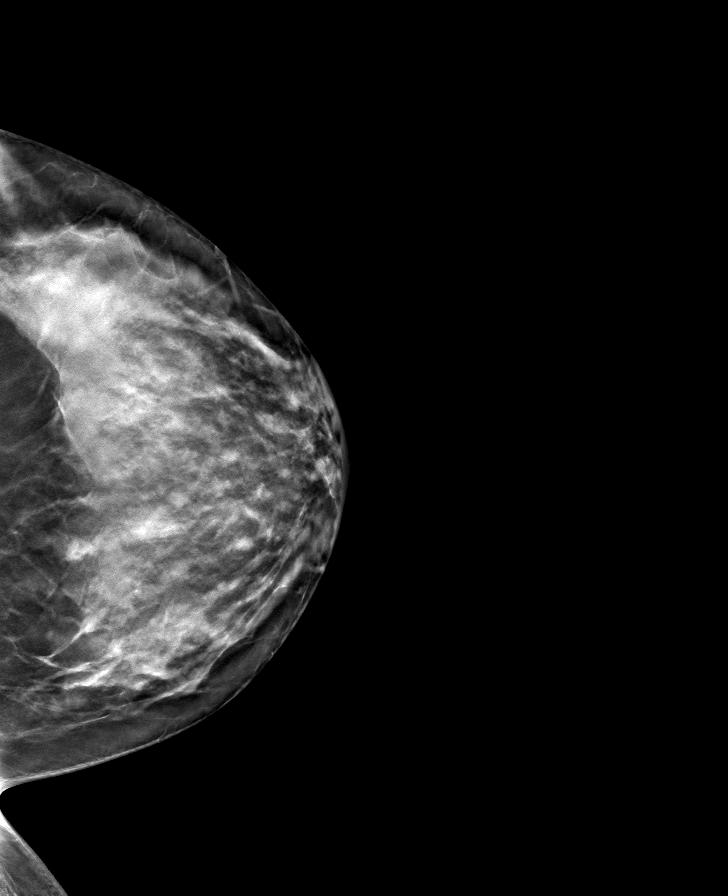

[R CC tomo · tomo slice 37/74.0]
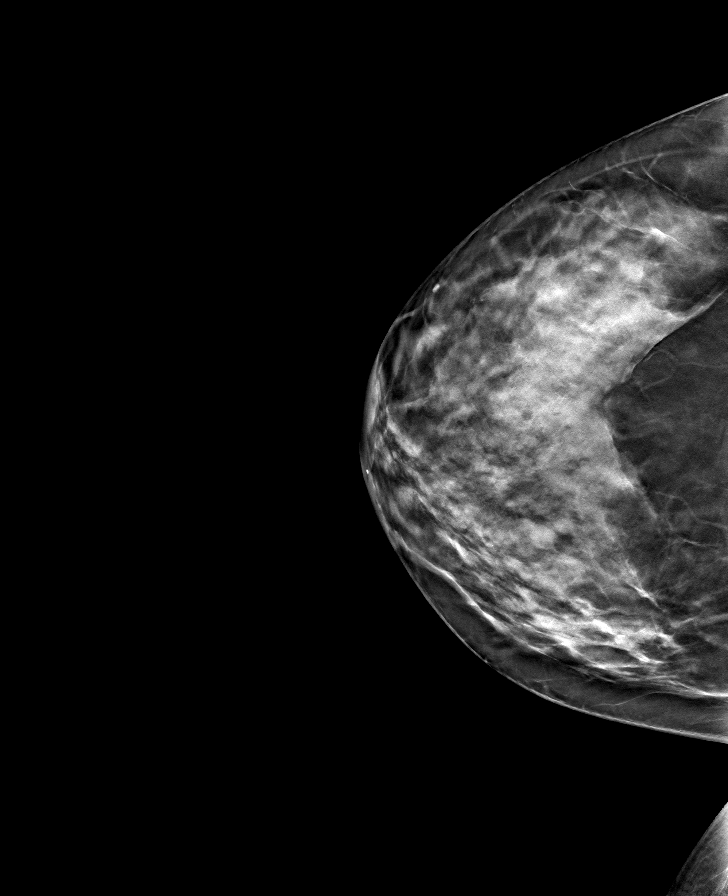

[L MLO tomo · tomo slice 40/79.0]
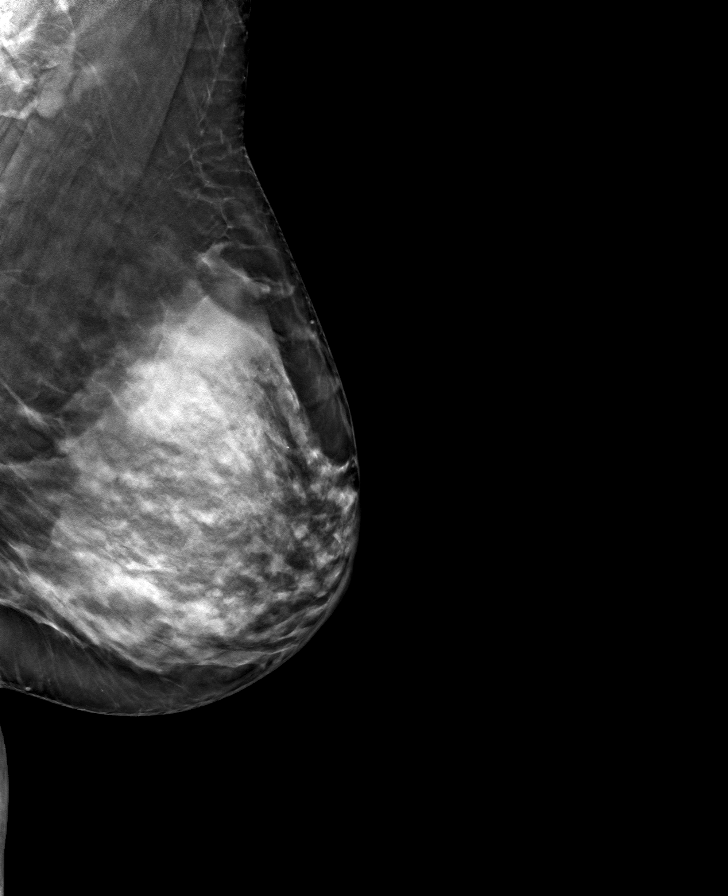

[8 of 24 positions shown; findings below may reference images not displayed]

ACR Breast Density Category c: The breast tissue is heterogeneously
dense, which may obscure small masses.
FINDINGS: There are no findings suspicious for malignancy.
IMPRESSION: No mammographic evidence of malignancy. A result letter of this
screening mammogram will be mailed directly to the patient.

RECOMMENDATION:
Screening mammogram in one year. (Code:Q3-W-BC3)

BI-RADS CATEGORY  1: Negative.

## 2023-03-11 ENCOUNTER — Encounter: Payer: Self-pay | Admitting: Nurse Practitioner

## 2023-03-11 ENCOUNTER — Ambulatory Visit: Payer: 59 | Admitting: Nurse Practitioner

## 2023-03-11 ENCOUNTER — Other Ambulatory Visit: Payer: Self-pay

## 2023-03-11 VITALS — BP 110/70 | HR 84 | Temp 98.2°F | Ht 62.0 in | Wt 161.6 lb

## 2023-03-11 DIAGNOSIS — K219 Gastro-esophageal reflux disease without esophagitis: Secondary | ICD-10-CM | POA: Diagnosis not present

## 2023-03-11 DIAGNOSIS — F419 Anxiety disorder, unspecified: Secondary | ICD-10-CM

## 2023-03-11 DIAGNOSIS — Z0000 Encounter for general adult medical examination without abnormal findings: Secondary | ICD-10-CM

## 2023-03-11 DIAGNOSIS — Z23 Encounter for immunization: Secondary | ICD-10-CM

## 2023-03-11 DIAGNOSIS — F32A Depression, unspecified: Secondary | ICD-10-CM

## 2023-03-11 MED ORDER — BUPROPION HCL ER (XL) 300 MG PO TB24: 300.0000 mg | ORAL_TABLET | Freq: Every day | ORAL | 3 refills | Status: DC

## 2023-03-11 MED ORDER — BUSPIRONE HCL 7.5 MG PO TABS: 7.5000 mg | ORAL_TABLET | Freq: Two times a day (BID) | ORAL | 0 refills | Status: DC

## 2023-03-11 MED ORDER — PANTOPRAZOLE SODIUM 40 MG PO TBEC
40.0000 mg | DELAYED_RELEASE_TABLET | Freq: Every day | ORAL | 3 refills | Status: DC
Start: 2023-03-11 — End: 2024-02-12

## 2023-03-11 NOTE — Progress Notes (Signed)
[name], NP-C Phone: [phone]  Vicki Reese is a 52 y.o. female who presents today to establish care and for annual exam. She recently completed lab work through [facility].   Anxiety/Depression- Currently on Wellbutrin XL 300 mg daily. She continues to have increased anxiety due to recent life stressors and changes happening in her life. She is interested in additional medication to help manage her symptoms. PHQ- 6 and GAD- 9 today. Denies SI/HI. She does not see a therapist or Psychiatrist.   GERD: Worsening symptoms  Reflux symptoms: Heartburn, cough, bitter taste in mouth   Abd pain: No   Blood in stool: No  Dysphagia: No   EGD: Never  Medication: OTC medication  Diet: Intermittent fasting, well balanced, increased vegetables, cooks mostly at home Exercise: None Pap smear: s/p hysterectomy Colonoscopy: Negative Cologuard- 10/14/2021 Mammogram: 02/21/2023 Family history-  Colon cancer: No  Breast cancer: Yes, mother  Ovarian cancer: No Menses: hysterectomy Sexually active: Yes Vaccines-   Flu: Not due  Tetanus: Unknown, 10+ years ago- Due!  COVID19: x 2 HIV screening: Negative Hep C Screening: Negative Tobacco use: No Alcohol use: Yes, 2 drinks per week Illicit Drug use: No Dentist: No Ophthalmology: Yes   Active Ambulatory Problems    Diagnosis Date Noted   Anxiety and depression 01/17/2015   Endometriosis determined by laparoscopy 01/19/2015   Pre-diabetes 03/21/2015   Fatigue 03/21/2015   Cannot sleep 09/28/2015   Menstrual migraine 11/07/2015   Body mass index (BMI) of 30.0-30.9 in adult 11/24/2015   Migraine with aura and without status migrainosus, not intractable 11/24/2015   Chronic pelvic pain in female 05/01/2016   Status post laparoscopic assisted vaginal hysterectomy (LAVH) 06/18/2016   Family history of breast cancer in mother 01/09/2018   Breast nodule 01/09/2018   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   Bursitis of hip 05/25/2020    Patellofemoral stress syndrome 01/17/2015   Gastroesophageal reflux disease 03/11/2023   Preventative health care 03/26/2023   Resolved Ambulatory Problems    Diagnosis Date Noted   Bursitis 01/19/2015   Abnormal weight gain 01/19/2015   Obesity 11/24/2015   Dyspareunia in female 05/01/2016   Abnormal uterine bleeding 05/01/2016   Status post laparoscopy-assisted vaginal hysterectomy 06/18/2016   Uterine leiomyoma 08/08/2016   Postop check 08/08/2016   Viral upper respiratory tract infection 05/25/2020   Cough 05/27/2020   Otalgia 05/27/2020   Past Medical History:  Diagnosis Date   Acid reflux    Anxiety    Arthritis 2017    Family History  Problem Relation Age of Onset   Breast cancer Mother 17   Arthritis Mother    Cancer Mother    Vision loss Mother    Lupus Father    Diabetes Father    Stroke Father    Heart disease Paternal Grandmother    Ovarian cancer Neg Hx    Colon cancer Neg Hx     Social History   Socioeconomic History   Marital status: Married    Spouse name: Not on file   Number of children: Not on file   Years of education: Not on file   Highest education level: Not on file  Occupational History   Not on file  Tobacco Use   Smoking status: Former    Current packs/day: 0.00    Types: Cigarettes    Quit date: 08/06/2006    Years since quitting: 16.6   Smokeless tobacco: Never   Tobacco comments:    QUIT IN 2008  Vaping Use  Vaping status: Never Used  Substance and Sexual Activity   Alcohol use: Yes    Alcohol/week: 1.0 standard drink of alcohol    Types: 1 Glasses of wine per week    Comment: OCCASIONALLY   Drug use: No   Sexual activity: Yes    Birth control/protection: Surgical  Other Topics Concern   Not on file  Social History Narrative   Not on file   Social Determinants of Health   Financial Resource Strain: Not on file  Food Insecurity: Not on file  Transportation Needs: Not on file  Physical Activity: Insufficiently  Active (03/18/2019)   Exercise Vital Sign    Days of Exercise per Week: 1 day    Minutes of Exercise per Session: 30 min  Stress: Not on file  Social Connections: Not on file  Intimate Partner Violence: Not on file    ROS  General:  Negative for unexplained weight loss, fever Skin: Negative for new or changing mole, sore that won't heal HEENT: Negative for trouble hearing, trouble seeing, ringing in ears, mouth sores, hoarseness, change in voice, dysphagia. CV:  Negative for chest pain, dyspnea, edema, palpitations Resp: Negative for cough, dyspnea, hemoptysis GI: Negative for nausea, vomiting, diarrhea, constipation, abdominal pain, melena, hematochezia. GU: Negative for dysuria, incontinence, urinary hesitance, hematuria, vaginal or penile discharge, polyuria, sexual difficulty, lumps in testicle or breasts MSK: Negative for muscle cramps or aches, joint pain or swelling Neuro: Negative for headaches, weakness, numbness, dizziness, passing out/fainting Psych: Negative for memory problems  Objective  Physical Exam Vitals:   03/11/23 0843  BP: 110/70  Pulse: 84  Temp: 98.2 F (36.8 C)  SpO2: 97%    BP Readings from Last 3 Encounters:  03/11/23 110/70  01/30/23 126/81  09/12/22 125/88   Wt Readings from Last 3 Encounters:  03/11/23 161 lb 9.6 oz (73.3 kg)  09/12/22 151 lb (68.5 kg)  09/28/21 164 lb 3.2 oz (74.5 kg)    Physical Exam Constitutional:      General: She is not in acute distress.    Appearance: Normal appearance.  HENT:     Head: Normocephalic.     Right Ear: Tympanic membrane normal.     Left Ear: Tympanic membrane normal.     Nose: Nose normal.     Mouth/Throat:     Mouth: Mucous membranes are moist.     Pharynx: Oropharynx is clear.  Eyes:     Conjunctiva/sclera: Conjunctivae normal.     Pupils: Pupils are equal, round, and reactive to light.  Neck:     Thyroid: No thyromegaly.  Cardiovascular:     Rate and Rhythm: Normal rate and regular  rhythm.     Heart sounds: Normal heart sounds.  Pulmonary:     Effort: Pulmonary effort is normal.     Breath sounds: Normal breath sounds.  Abdominal:     General: Abdomen is flat. Bowel sounds are normal.     Palpations: Abdomen is soft. There is no mass.     Tenderness: There is no abdominal tenderness.  Musculoskeletal:        General: Normal range of motion.  Lymphadenopathy:     Cervical: No cervical adenopathy.  Skin:    General: Skin is warm and dry.     Findings: No rash.  Neurological:     General: No focal deficit present.     Mental Status: She is alert.  Psychiatric:        Mood and Affect: Mood normal.  Behavior: Behavior normal.    Assessment/Plan:   Preventative health care Assessment & Plan: Physical exam complete. Patient completed lab work with [facility], she will send [place] her results via MyChart, will review. Pap- no longer indicated. Mammogram- UTD. Cologuard- UTD. Flu vaccine not due. Tetanus vaccine- due, given today in office. Declined additional COVID vaccines. HIV and Hep C screenings- negative. Recommended establishing with Dentist and follow up with Ophthalmology for annual exams. Encouraged to continue healthy diet and begin exercising. Return to care in 4 weeks, sooner PRN.    Anxiety and depression Assessment & Plan: Chronic issue. Worsening anxiety symptoms. Will start patient on Buspar 7.5 mg BID, she will continue her Wellbutrin XL 300 mg daily. Counseled patient on common side effects. Encouraged to contact if worsening symptoms, unusual behavior changes or suicidal thoughts occur. PHQ- 6 and GAD- 9 today. Denies SI/HI. She will follow up in 4 weeks, sooner PRN. Will continue to monitor.   Orders: -     buPROPion HCl ER (XL); Take 1 tablet (300 mg total) by mouth daily.  Dispense: 90 tablet; Refill: 3 -     busPIRone HCl; Take 1 tablet (7.5 mg total) by mouth 2 (two) times daily.  Dispense: 180 tablet; Refill: 0  Gastroesophageal reflux  disease, unspecified whether esophagitis present Assessment & Plan: Chronic issue. Worsening symptoms. Taking OTC antacids. Will start patient on Protonix 40 mg daily. Encouraged to monitor diet for triggers such as decreasing alcohol and caffeine intake and avoiding spicy and acidic foods. Dietary information provided to patient. She will contact if symptoms are not improving or are changing. Will continue to monitor.   Orders: -     Pantoprazole Sodium; Take 1 tablet (40 mg total) by mouth daily.  Dispense: 90 tablet; Refill: 3  Need for Tdap vaccination -     Tdap vaccine greater than or equal to 7yo IM   Return in about 4 weeks (around 04/08/2023) for Follow up.   [name], NP-C [facility] - [employer]

## 2023-03-12 ENCOUNTER — Encounter: Payer: Self-pay | Admitting: Nurse Practitioner

## 2023-03-21 ENCOUNTER — Encounter: Payer: Self-pay | Admitting: Obstetrics and Gynecology

## 2023-03-21 ENCOUNTER — Encounter (INDEPENDENT_AMBULATORY_CARE_PROVIDER_SITE_OTHER): Payer: Self-pay

## 2023-03-22 ENCOUNTER — Other Ambulatory Visit: Payer: Self-pay | Admitting: Oncology

## 2023-03-22 ENCOUNTER — Other Ambulatory Visit: Payer: Self-pay

## 2023-03-22 DIAGNOSIS — N951 Menopausal and female climacteric states: Secondary | ICD-10-CM

## 2023-03-22 DIAGNOSIS — Z006 Encounter for examination for normal comparison and control in clinical research program: Secondary | ICD-10-CM

## 2023-03-22 MED ORDER — ESTRADIOL 1 MG PO TABS: 1.0000 mg | ORAL_TABLET | Freq: Every day | ORAL | 0 refills | Status: DC

## 2023-03-26 ENCOUNTER — Encounter: Payer: Self-pay | Admitting: Nurse Practitioner

## 2023-03-26 DIAGNOSIS — Z0000 Encounter for general adult medical examination without abnormal findings: Secondary | ICD-10-CM

## 2023-03-26 HISTORY — DX: Encounter for general adult medical examination without abnormal findings: Z00.00

## 2023-03-26 NOTE — Assessment & Plan Note (Signed)
Physical exam complete. Patient completed lab work with [facility], she will send [place] her results via MyChart, will review. Pap- no longer indicated. Mammogram- UTD. Cologuard- UTD. Flu vaccine not due. Tetanus vaccine- due, given today in office. Declined additional COVID vaccines. HIV and Hep C screenings- negative. Recommended establishing with Dentist and follow up with Ophthalmology for annual exams. Encouraged to continue healthy diet and begin exercising. Return to care in 4 weeks, sooner PRN.

## 2023-03-26 NOTE — Assessment & Plan Note (Signed)
Chronic issue. Worsening symptoms. Taking OTC antacids. Will start patient on Protonix 40 mg daily. Encouraged to monitor diet for triggers such as decreasing alcohol and caffeine intake and avoiding spicy and acidic foods. Dietary information provided to patient. She will contact if symptoms are not improving or are changing. Will continue to monitor.

## 2023-03-26 NOTE — Assessment & Plan Note (Signed)
Chronic issue. Worsening anxiety symptoms. Will start patient on Buspar 7.5 mg BID, she will continue her Wellbutrin XL 300 mg daily. Counseled patient on common side effects. Encouraged to contact if worsening symptoms, unusual behavior changes or suicidal thoughts occur. PHQ- 6 and GAD- 9 today. Denies SI/HI. She will follow up in 4 weeks, sooner PRN. Will continue to monitor.

## 2023-04-09 ENCOUNTER — Encounter: Payer: Self-pay | Admitting: Nurse Practitioner

## 2023-04-09 ENCOUNTER — Ambulatory Visit: Payer: 59 | Admitting: Nurse Practitioner

## 2023-04-09 VITALS — BP 100/72 | HR 75 | Temp 98.2°F | Ht 62.0 in | Wt 165.4 lb

## 2023-04-09 DIAGNOSIS — Z23 Encounter for immunization: Secondary | ICD-10-CM

## 2023-04-09 DIAGNOSIS — F419 Anxiety disorder, unspecified: Secondary | ICD-10-CM | POA: Diagnosis not present

## 2023-04-09 DIAGNOSIS — K219 Gastro-esophageal reflux disease without esophagitis: Secondary | ICD-10-CM | POA: Diagnosis not present

## 2023-04-09 DIAGNOSIS — F32A Depression, unspecified: Secondary | ICD-10-CM

## 2023-04-09 NOTE — Progress Notes (Signed)
Vicki Dicker, NP-C Phone: [phone]  Vicki Reese is a 52 y.o. female who presents today for follow up.   Anxiety/Depression- Patient started on Buspar 7.5 mg BID on 03/11/2023. She has had improvement in her anxiety since starting on the medication. She reports feeling much better. She feels that she is sleeping better also. She has continued to take her Wellbutrin XL 300 mg daily as well.   GERD: Started on Protonix 40 mg 03/11/2023.   Reflux symptoms: None since starting on medication   Abd pain: No   Blood in stool: No  Dysphagia: No   EGD: Never  Medication: Protonix  Social History   Tobacco Use  Smoking Status Former   Current packs/day: 0.00   Types: Cigarettes   Quit date: 08/06/2006   Years since quitting: 16.6  Smokeless Tobacco Never  Tobacco Comments   QUIT IN 2008    Current Outpatient Medications on File Prior to Visit  Medication Sig Dispense Refill   buPROPion (WELLBUTRIN XL) 300 MG 24 hr tablet Take 1 tablet (300 mg total) by mouth daily. 90 tablet 3   estradiol (ESTRACE) 1 MG tablet Take 1 tablet (1 mg total) by mouth daily. 90 tablet 0   pantoprazole (PROTONIX) 40 MG tablet Take 1 tablet (40 mg total) by mouth daily. 90 tablet 3   No current facility-administered medications on file prior to visit.    ROS see history of present illness  Objective  Physical Exam Vitals:   04/09/23 0810  BP: 100/72  Pulse: 75  Temp: 98.2 F (36.8 C)  SpO2: 98%    BP Readings from Last 3 Encounters:  04/09/23 100/72  03/11/23 110/70  01/30/23 126/81   Wt Readings from Last 3 Encounters:  04/09/23 165 lb 6.4 oz (75 kg)  03/11/23 161 lb 9.6 oz (73.3 kg)  09/12/22 151 lb (68.5 kg)    Physical Exam Constitutional:      General: She is not in acute distress.    Appearance: Normal appearance.  HENT:     Head: Normocephalic.  Cardiovascular:     Rate and Rhythm: Normal rate and regular rhythm.     Heart sounds: Normal heart sounds.  Pulmonary:      Effort: Pulmonary effort is normal.     Breath sounds: Normal breath sounds.  Skin:    General: Skin is warm and dry.  Neurological:     General: No focal deficit present.     Mental Status: She is alert.  Psychiatric:        Mood and Affect: Mood normal.        Behavior: Behavior normal.    Assessment/Plan: Please see individual problem list.  Anxiety and depression Assessment & Plan: Chronic. Significant improvement in anxiety and sleep since starting on Buspar 7.5 mg BID. She does not feel that she needs to increase her dose at this time. Refills sent. She will continue her Wellbutrin XL 300 mg daily. Encouraged to contact if worsening or changing symptoms, unusual behavior changes or suicidal thoughts occur. Will continue to monitor.   Orders: -     busPIRone HCl; Take 1 tablet (7.5 mg total) by mouth 2 (two) times daily.  Dispense: 180 tablet; Refill: 2  Gastroesophageal reflux disease, unspecified whether esophagitis present Assessment & Plan: Chronic issue. Symptoms resolved since starting Protonix 40 mg daily. Continue. Encouraged to continue monitoring diet for triggers. She will contact if having worsening or changing symptoms. Will continue to monitor.    Need  for shingles vaccine -     Varicella-zoster vaccine IM   Return in about 5 months (around 09/09/2023) for Follow up .   Vicki Dicker, NP-C [facility] - [employer]

## 2023-04-10 ENCOUNTER — Encounter: Payer: Self-pay | Admitting: Nurse Practitioner

## 2023-04-10 MED ORDER — BUSPIRONE HCL 7.5 MG PO TABS: 7.5000 mg | ORAL_TABLET | Freq: Two times a day (BID) | ORAL | 2 refills | Status: DC

## 2023-04-10 NOTE — Assessment & Plan Note (Signed)
Chronic. Significant improvement in anxiety and sleep since starting on Buspar 7.5 mg BID. She does not feel that she needs to increase her dose at this time. Refills sent. She will continue her Wellbutrin XL 300 mg daily. Encouraged to contact if worsening or changing symptoms, unusual behavior changes or suicidal thoughts occur. Will continue to monitor.

## 2023-04-10 NOTE — Assessment & Plan Note (Signed)
Chronic issue. Symptoms resolved since starting Protonix 40 mg daily. Continue. Encouraged to continue monitoring diet for triggers. She will contact if having worsening or changing symptoms. Will continue to monitor.

## 2023-04-30 ENCOUNTER — Ambulatory Visit: Payer: 59 | Admitting: Obstetrics and Gynecology

## 2023-04-30 DIAGNOSIS — N951 Menopausal and female climacteric states: Secondary | ICD-10-CM

## 2023-04-30 DIAGNOSIS — Z79890 Hormone replacement therapy (postmenopausal): Secondary | ICD-10-CM

## 2023-04-30 DIAGNOSIS — Z01419 Encounter for gynecological examination (general) (routine) without abnormal findings: Secondary | ICD-10-CM

## 2023-06-13 ENCOUNTER — Encounter: Payer: Self-pay | Admitting: Nurse Practitioner

## 2023-09-13 ENCOUNTER — Ambulatory Visit: Payer: 59 | Admitting: Nurse Practitioner

## 2023-11-27 ENCOUNTER — Ambulatory Visit
Admission: RE | Admit: 2023-11-27 | Discharge: 2023-11-27 | Disposition: A | Discharge status: Home / Self Care | Source: Ambulatory Visit | Attending: Physician Assistant | Admitting: Physician Assistant

## 2023-11-27 ENCOUNTER — Encounter: Payer: Self-pay | Admitting: Nurse Practitioner

## 2023-11-27 ENCOUNTER — Telehealth: Payer: Self-pay

## 2023-11-27 VITALS — BP 143/89 | HR 79 | Temp 98.2°F | Resp 18

## 2023-11-27 DIAGNOSIS — R319 Hematuria, unspecified: Secondary | ICD-10-CM | POA: Diagnosis present

## 2023-11-27 DIAGNOSIS — N390 Urinary tract infection, site not specified: Secondary | ICD-10-CM | POA: Diagnosis present

## 2023-11-27 HISTORY — DX: Systemic lupus erythematosus, unspecified: M32.9

## 2023-11-27 LAB — POCT URINALYSIS DIP (MANUAL ENTRY)
Bilirubin, UA: NEGATIVE
Glucose, UA: NEGATIVE mg/dL
Ketones, POC UA: NEGATIVE mg/dL
Nitrite, UA: POSITIVE — AB
Protein Ur, POC: 30 mg/dL — AB
Spec Grav, UA: 1.015 (ref 1.010–1.025)
Urobilinogen, UA: 0.2 U/dL
pH, UA: 7 (ref 5.0–8.0)

## 2023-11-27 MED ORDER — NITROFURANTOIN MONOHYD MACRO 100 MG PO CAPS: 100.0000 mg | ORAL_CAPSULE | Freq: Two times a day (BID) | ORAL | 0 refills | Status: AC

## 2023-11-27 MED ORDER — PHENAZOPYRIDINE HCL 100 MG PO TABS: 100.0000 mg | ORAL_TABLET | Freq: Three times a day (TID) | ORAL | 0 refills | Status: DC | PRN

## 2023-11-27 NOTE — ED Triage Notes (Signed)
 Pt presents with complaints of urinary frequency, urinary odor, and dysuria that started yesterday. Pt endorses lower abdomen and back cramping. Hx of uti.

## 2023-11-27 NOTE — Discharge Instructions (Addendum)
 Based on your symptoms and results of the urinalysis I believe you have a UTI I recommend the following:  I have sent in a script for Macrobid  for you to take twice per day for 5 days   Please finish the entire course of the antibiotic even if you are feeling better before it is completed unless instructed otherwise by a medical provider or if you develop an allergic reaction   Stay well hydrated (at least 75 oz of water per day) and avoid holding your urine We will keep you updated on your urine culture results and if any changes need to be made to your management regimen  If you have any of the following please let us  know: persistent symptoms, fever, trouble urinating or inability to urinate, confusion, flank pain.

## 2023-11-27 NOTE — ED Provider Notes (Signed)
 [facility]    CSN: [id number] Arrival date & time: 11/27/23  1338      History   Chief Complaint Chief Complaint  Patient presents with   Urinary Frequency    UTI- cramping, pain when urinating, back pain, odor to urine - Entered by patient    HPI Vicki Reese is a 53 y.o. female.   HPI  She reports she has been having dysuria, increased urinary frequency, urinary odor and back pain since yesterday She denies fever or chills but does report feeling flushed She reports some urinary hesitancy but denies difficulty urinating  Interventions: last night she took AZO and Cystex     Past Medical History:  Diagnosis Date   Acid reflux    Anxiety    Arthritis 2017   Bursitis in left hip   Lupus (systemic lupus erythematosus) (HCC)    Menstrual migraine 11/07/2015   Preventative health care 03/26/2023    Patient Active Problem List   Diagnosis Date Noted   Preventative health care 03/26/2023   Gastroesophageal reflux disease 03/11/2023   Bursitis of hip 05/25/2020   Family history of breast cancer in mother 01/09/2018   Breast nodule 01/09/2018   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   Status post laparoscopic assisted vaginal hysterectomy (LAVH) 06/18/2016   Chronic pelvic pain in female 05/01/2016   Body mass index (BMI) of 30.0-30.9 in adult 11/24/2015   Migraine with aura and without status migrainosus, not intractable 11/24/2015   Menstrual migraine 11/07/2015   Cannot sleep 09/28/2015   Pre-diabetes 03/21/2015   Fatigue 03/21/2015   Endometriosis determined by laparoscopy 01/19/2015   Anxiety and depression 01/17/2015   Patellofemoral stress syndrome 01/17/2015    Past Surgical History:  Procedure Laterality Date   ABDOMINAL HYSTERECTOMY     BREAST BIOPSY Right 01/28/2018   benign   DIAGNOSTIC LAPAROSCOPY     DILATION AND CURETTAGE OF UTERUS     LAPAROSCOPIC ASSISTED VAGINAL HYSTERECTOMY N/A 06/18/2016   Procedure: LAPAROSCOPIC ASSISTED  VAGINAL HYSTERECTOMY WITH BILATERAL SALPINGECTOMY;  Surgeon: Colan Dash, MD;  Location: [facility] ORS;  Service: Gynecology;  Laterality: N/A;   laparoscopy      OB History     Gravida  3   Para  2   Term  2   Preterm      AB  1   Living  2      SAB  1   IAB      Ectopic      Multiple      Live Births  2            Home Medications    Prior to Admission medications   Medication Sig Start Date End Date Taking? Authorizing Provider  naltrexone (DEPADE) 50 MG tablet Take 3.5 mg by mouth daily.   Yes [provider]  nitrofurantoin , macrocrystal-monohydrate, (MACROBID ) 100 MG capsule Take 1 capsule (100 mg total) by mouth 2 (two) times daily for 5 days. 11/27/23 12/02/23 Yes Birda Didonato E, PA-C  phenazopyridine  (PYRIDIUM ) 100 MG tablet Take 1 tablet (100 mg total) by mouth 3 (three) times daily as needed for pain. 11/27/23  Yes Khloei Spiker E, PA-C  buPROPion  (WELLBUTRIN  XL) 300 MG 24 hr tablet Take 1 tablet (300 mg total) by mouth daily. 03/11/23   Bluford Burkitt, NP  busPIRone  (BUSPAR ) 7.5 MG tablet Take 1 tablet (7.5 mg total) by mouth 2 (two) times daily. 04/10/23   Bluford Burkitt, NP  estradiol  (ESTRACE ) 1 MG tablet  Take 1 tablet (1 mg total) by mouth daily. 03/22/23   Zenobia Hila, MD  pantoprazole  (PROTONIX ) 40 MG tablet Take 1 tablet (40 mg total) by mouth daily. 03/11/23   Bluford Burkitt, NP    Family History Family History  Problem Relation Age of Onset   Breast cancer Mother 2   Arthritis Mother    Cancer Mother    Vision loss Mother    Lupus Father    Diabetes Father    Stroke Father    Heart disease Paternal Grandmother    Ovarian cancer Neg Hx    Colon cancer Neg Hx     Social History Social History   Tobacco Use   Smoking status: Former    Current packs/day: 0.00    Types: Cigarettes    Quit date: 08/06/2006    Years since quitting: 17.3   Smokeless tobacco: Never   Tobacco comments:    QUIT IN 2008  Vaping Use   Vaping status:  Never Used  Substance Use Topics   Alcohol use: Yes    Alcohol/week: 1.0 standard drink of alcohol    Types: 1 Glasses of wine per week    Comment: OCCASIONALLY   Drug use: No     Allergies   Duloxetine, Bactrim [sulfamethoxazole-trimethoprim], and Gabapentin   Review of Systems Review of Systems  Constitutional:  Positive for diaphoresis. Negative for chills and fever.  Genitourinary:  Positive for dysuria, flank pain, frequency and urgency. Negative for difficulty urinating and hematuria.     Physical Exam Triage Vital Signs ED Triage Vitals  Encounter Vitals Group     BP 11/27/23 1427 (!) 143/89     Systolic BP Percentile --      Diastolic BP Percentile --      Pulse Rate 11/27/23 1427 79     Resp 11/27/23 1427 18     Temp 11/27/23 1427 98.2 F (36.8 C)     Temp src --      SpO2 11/27/23 1427 97 %     Weight --      Height --      Head Circumference --      Peak Flow --      Pain Score 11/27/23 1424 6     Pain Loc --      Pain Education --      Exclude from Growth Chart --    No data found.  Updated Vital Signs BP (!) 143/89   Pulse 79   Temp 98.2 F (36.8 C)   Resp 18   LMP 06/06/2016 (Approximate)   SpO2 97%   Visual Acuity Right Eye Distance:   Left Eye Distance:   Bilateral Distance:    Right Eye Near:   Left Eye Near:    Bilateral Near:     Physical Exam Vitals reviewed.  Constitutional:      General: She is awake.     Appearance: Normal appearance. She is well-developed and well-groomed.  HENT:     Head: Normocephalic and atraumatic.  Eyes:     General: Lids are normal. Gaze aligned appropriately.     Extraocular Movements: Extraocular movements intact.     Conjunctiva/sclera: Conjunctivae normal.  Pulmonary:     Effort: Pulmonary effort is normal.  Neurological:     General: No focal deficit present.     Mental Status: She is alert and oriented to person, place, and time.     GCS: GCS eye subscore is 4. GCS verbal  subscore is 5.  GCS motor subscore is 6.     Cranial Nerves: No cranial nerve deficit, dysarthria or facial asymmetry.  Psychiatric:        Attention and Perception: Attention and perception normal.        Mood and Affect: Mood and affect normal.        Speech: Speech normal.        Behavior: Behavior normal. Behavior is cooperative.      UC Treatments / Results  Labs (all labs ordered are listed, but only abnormal results are displayed) Labs Reviewed  POCT URINALYSIS DIP (MANUAL ENTRY) - Abnormal; Notable for the following components:      Result Value   Clarity, UA turbid (*)    Blood, UA large (*)    Protein Ur, POC =30 (*)    Nitrite, UA Positive (*)    Leukocytes, UA Moderate (2+) (*)    All other components within normal limits  URINE CULTURE    EKG   Radiology No results found.  Procedures Procedures (including critical care time)  Medications Ordered in UC Medications - No data to display  Initial Impression / Assessment and Plan / UC Course  I have reviewed the triage vital signs and the nursing notes.  Pertinent labs & imaging results that were available during my care of the patient were reviewed by me and considered in my medical decision making (see chart for details).      Final Clinical Impressions(s) / UC Diagnoses   Final diagnoses:  Urinary tract infection with hematuria, site unspecified   Acute,recurrent concern Patient reports symptoms comprised of the following: dysuria, incomplete voiding, increased urinary frequency, urinary urgency since yesterday afternoon Results of UA are consistent with UTI - urine sample sent for culture to determine causative organism and susceptibility- results to dictate further management  Recommend starting Macrobid  PO BID x 5 days  Will provide script - discussed importance of finishing entire course of abx and staying well hydrated while recovering from UTI Reviewed ED precautions with patient Script for Pyridium  also  provided to assist with discomfort. Patient denies previous hx of abx-induced yeast infections.  Follow up as needed for persistent or worsening symptoms    Discharge Instructions      Based on your symptoms and results of the urinalysis I believe you have a UTI I recommend the following:  I have sent in a script for Macrobid  for you to take twice per day for 5 days   Please finish the entire course of the antibiotic even if you are feeling better before it is completed unless instructed otherwise by a medical provider or if you develop an allergic reaction   Stay well hydrated (at least 75 oz of water per day) and avoid holding your urine We will keep you updated on your urine culture results and if any changes need to be made to your management regimen  If you have any of the following please let us  know: persistent symptoms, fever, trouble urinating or inability to urinate, confusion, flank pain.       ED Prescriptions     Medication Sig Dispense Auth. Provider   nitrofurantoin , macrocrystal-monohydrate, (MACROBID ) 100 MG capsule Take 1 capsule (100 mg total) by mouth 2 (two) times daily for 5 days. 10 capsule Fiona Coto E, PA-C   phenazopyridine  (PYRIDIUM ) 100 MG tablet Take 1 tablet (100 mg total) by mouth 3 (three) times daily as needed for pain. 10 tablet Tashanna Dolin E,  PA-C      PDMP not reviewed this encounter.   Jerona Mooring, PA-C 11/27/23 1521

## 2023-11-27 NOTE — Telephone Encounter (Signed)
 Copied from CRM [id number]. Topic: Appointments - Appointment Scheduling >> Nov 27, 2023 12:15 PM Allyne Areola wrote: Patient/patient representative is calling to schedule an appointment. Refer to attachments for appointment information. Patient called to schedule an appointment  for a UTI, I scheduled tomorrow with Dr.Bair;however, patient is upset because she has a history of UTIs and when she established care with Bluford Burkitt she mentioned it and was told to just call in and she would be able to get an antibiotic without being seen.

## 2023-11-28 ENCOUNTER — Ambulatory Visit

## 2023-12-01 LAB — URINE CULTURE: Culture: >=100000 — AB

## 2023-12-02 ENCOUNTER — Telehealth (HOSPITAL_COMMUNITY): Payer: Self-pay

## 2023-12-02 MED ORDER — CEPHALEXIN 500 MG PO CAPS: 500.0000 mg | ORAL_CAPSULE | Freq: Two times a day (BID) | ORAL | 0 refills | Status: AC

## 2023-12-02 NOTE — Telephone Encounter (Signed)
 Per protocol, pt requires treatment with Keflex .  Reviewed with patient, verified pharmacy, prescription sent

## 2024-02-09 ENCOUNTER — Other Ambulatory Visit: Payer: Self-pay | Admitting: Nurse Practitioner

## 2024-02-09 DIAGNOSIS — K219 Gastro-esophageal reflux disease without esophagitis: Secondary | ICD-10-CM

## 2024-02-10 NOTE — Telephone Encounter (Signed)
 Detailed vm left informing pt to schedule an appt as she is overdue for a follow up

## 2024-02-11 ENCOUNTER — Other Ambulatory Visit: Payer: Self-pay | Admitting: Nurse Practitioner

## 2024-02-11 DIAGNOSIS — F32A Depression, unspecified: Secondary | ICD-10-CM

## 2024-03-02 ENCOUNTER — Other Ambulatory Visit: Payer: Self-pay | Admitting: Nurse Practitioner

## 2024-03-02 DIAGNOSIS — F32A Depression, unspecified: Secondary | ICD-10-CM

## 2024-04-17 ENCOUNTER — Other Ambulatory Visit: Payer: Self-pay | Admitting: Nurse Practitioner

## 2024-04-17 DIAGNOSIS — Z1231 Encounter for screening mammogram for malignant neoplasm of breast: Secondary | ICD-10-CM

## 2024-04-22 ENCOUNTER — Ambulatory Visit (INDEPENDENT_AMBULATORY_CARE_PROVIDER_SITE_OTHER): Admitting: Nurse Practitioner

## 2024-04-22 ENCOUNTER — Encounter: Payer: Self-pay | Admitting: Nurse Practitioner

## 2024-04-22 VITALS — BP 120/88 | HR 76 | Temp 98.5°F | Ht 62.0 in | Wt 138.4 lb

## 2024-04-22 DIAGNOSIS — Z0000 Encounter for general adult medical examination without abnormal findings: Secondary | ICD-10-CM

## 2024-04-22 DIAGNOSIS — F32A Depression, unspecified: Secondary | ICD-10-CM

## 2024-04-22 DIAGNOSIS — Z79890 Hormone replacement therapy (postmenopausal): Secondary | ICD-10-CM | POA: Diagnosis not present

## 2024-04-22 DIAGNOSIS — K219 Gastro-esophageal reflux disease without esophagitis: Secondary | ICD-10-CM

## 2024-04-22 DIAGNOSIS — F419 Anxiety disorder, unspecified: Secondary | ICD-10-CM

## 2024-04-22 DIAGNOSIS — M329 Systemic lupus erythematosus, unspecified: Secondary | ICD-10-CM | POA: Diagnosis not present

## 2024-04-22 DIAGNOSIS — Z23 Encounter for immunization: Secondary | ICD-10-CM

## 2024-04-22 MED ORDER — PANTOPRAZOLE SODIUM 40 MG PO TBEC: 40.0000 mg | DELAYED_RELEASE_TABLET | Freq: Every day | ORAL | 3 refills | Status: AC

## 2024-04-22 MED ORDER — BUPROPION HCL ER (XL) 300 MG PO TB24: 300.0000 mg | ORAL_TABLET | Freq: Every day | ORAL | 3 refills | Status: AC

## 2024-04-22 NOTE — Progress Notes (Signed)
 Leron Glance, NP-C Phone: [phone]  Vicki Reese is a 53 y.o. female who presents today for annual exam.   Discussed the use of AI scribe software for clinical note transcription with the patient, who gave verbal consent to proceed.  History of Present Illness   Vicki Reese is a 53 year old female with lupus who presents for annual exam.  She was diagnosed with lupus through blood work by an integrative medicine provider in September of last year. She is currently taking naltrexone for inflammation, which has significantly improved her symptoms, particularly joint pain in her hips that previously disrupted sleep after minimal physical activity. She also experienced periodic rashes on her arms, associated with stress or anxiety, but these have not occurred since starting medication.  Her current medications include naltrexone for inflammation, magnesium for bowel regulation, vitamin D3, B12, and potassium supplements. She has discontinued estradiol  patches and is now using estrogen and testosterone pellets. She has a history of early menopause following a hysterectomy and reports improved emotional well-being and increased libido since starting hormone pellets. She continues to take Wellbutrin  and Buspar  for anxiety and depression, which are well-managed. Protonix  is used for acid reflux, which she finds effective.  She denies constipation, diarrhea, abdominal pain, chest pain, shortness of breath, or skin changes. She denies smoking or drug use and drinks alcohol socially. Her diet is well-rounded, and she cooks dinner four nights a week. Although she does not engage in regular exercise, she remains active through daily activities.  Her family history includes breast cancer in her mother. She has declined further COVID and shingles vaccinations due to adverse reactions to the first shingles vaccine.      Social History   Tobacco Use  Smoking Status Former   Current  packs/day: 0.00   Types: Cigarettes   Quit date: 08/06/2006   Years since quitting: 17.7  Smokeless Tobacco Never  Tobacco Comments   QUIT IN 2008    Current Outpatient Medications on File Prior to Visit  Medication Sig Dispense Refill   busPIRone  (BUSPAR ) 7.5 MG tablet TAKE ONE TABLET BY MOUTH TWICE DAILY 180 tablet 3   Estradiol  6 MG PLLT by Implant route every 3 (three) months.     magnesium gluconate (MAGONATE) 500 (27 Mg) MG TABS tablet Take 500 mg by mouth at bedtime.     Naltrexone HCl, Pain, 4.5 MG CAPS Take by mouth daily.     progesterone (PROMETRIUM) 100 MG capsule Take 100 mg by mouth at bedtime.     Testosterone 25 MG PLLT by Implant route every 3 (three) months.     vitamin B-12 (CYANOCOBALAMIN) 100 MCG tablet Take 100 mcg by mouth daily.     Vitamin D-Vitamin K (VITAMIN D2 + K1 PO) Take by mouth daily.     No current facility-administered medications on file prior to visit.     ROS see history of present illness  Objective  Physical Exam Vitals:   04/22/24 1530  BP: 120/88  Pulse: 76  Temp: 98.5 F (36.9 C)  SpO2: 96%    BP Readings from Last 3 Encounters:  05/06/24 124/88  05/04/24 119/84  04/22/24 120/88   Wt Readings from Last 3 Encounters:  05/04/24 141 lb (64 kg)  04/22/24 138 lb 6.4 oz (62.8 kg)  04/09/23 165 lb 6.4 oz (75 kg)    Physical Exam Constitutional:      General: She is not in acute distress.    Appearance: Normal appearance.  HENT:     Head: Normocephalic.     Right Ear: Tympanic membrane normal.     Left Ear: Tympanic membrane normal.     Nose: Nose normal.     Mouth/Throat:     Mouth: Mucous membranes are moist.     Pharynx: Oropharynx is clear.  Eyes:     Conjunctiva/sclera: Conjunctivae normal.     Pupils: Pupils are equal, round, and reactive to light.  Neck:     Thyroid : No thyromegaly.  Cardiovascular:     Rate and Rhythm: Normal rate and regular rhythm.     Heart sounds: Normal heart sounds.  Pulmonary:      Effort: Pulmonary effort is normal.     Breath sounds: Normal breath sounds.  Abdominal:     General: Abdomen is flat. Bowel sounds are normal.     Palpations: Abdomen is soft. There is no mass.     Tenderness: There is no abdominal tenderness.  Musculoskeletal:        General: Normal range of motion.  Lymphadenopathy:     Cervical: No cervical adenopathy.  Skin:    General: Skin is warm and dry.     Findings: No rash.  Neurological:     General: No focal deficit present.     Mental Status: She is alert.  Psychiatric:        Mood and Affect: Mood normal.        Behavior: Behavior normal.      Assessment/Plan: Please see individual problem list.  Preventative health care Assessment & Plan: Routine wellness visit reveals no new significant health issues. Physical exam complete. Lab work deferred today as she has recently had labs with her other provider. Pap smears are no longer indicated due to hysterectomy. Mammogram is scheduled for September 30th. She had a negative Cologuard in 2023. Weight loss and overall well-being have improved. She declines the second shingles vaccine due to an adverse reaction to the first dose and also declines flu and COVID vaccines at this time. Tetanus vaccine is up to date. She maintains an active lifestyle, balanced diet, and abstains from smoking and drug use, with social alcohol consumption. Regular eye exams are conducted, but dental visits are avoided due to fear. Continue current lifestyle and dietary habits. Return to care in one year, sooner as needed.    Systemic lupus erythematosus, unspecified SLE type, unspecified organ involvement status (HCC) Assessment & Plan: Lupus symptoms have improved with naltrexone. She is not currently interested in a rheumatology referral unless her condition worsens. Regular monitoring is conducted by her integrative medicine provider. Continue naltrexone for inflammation. Follow up with the integrative medicine  provider every three months. Monitor for any changes or worsening of symptoms.   On postmenopausal hormone replacement therapy Assessment & Plan: Post-hysterectomy menopausal state is managed with estradiol  and testosterone pellets, resulting in significant improvement in mood, energy, and libido. Continue estradiol  and testosterone pellet therapy. Follow up with the integrative medicine provider for pellet therapy every three months.   Anxiety and depression Assessment & Plan: Depression and anxiety are well-managed with Wellbutrin  and Buspar . Emotional well-being has improved, with occasional stress related to her son's college transition. Continue Wellbutrin  and Buspar . Monitor emotional well-being and stress levels.  Orders: -     buPROPion  HCl ER (XL); Take 1 tablet (300 mg total) by mouth daily.  Dispense: 90 tablet; Refill: 3  Gastroesophageal reflux disease, unspecified whether esophagitis present Assessment & Plan: GERD symptoms are well-controlled with Protonix .  Continue Protonix  and dietary modifications.   Orders: -     Pantoprazole  Sodium; Take 1 tablet (40 mg total) by mouth daily.  Dispense: 90 tablet; Refill: 3     Return in about 1 year (around 04/22/2025) for Annual Exam, sooner as needed.   Leron Glance, NP-C [facility]

## 2024-05-04 ENCOUNTER — Ambulatory Visit: Payer: Self-pay

## 2024-05-04 ENCOUNTER — Encounter: Payer: Self-pay | Admitting: Family Medicine

## 2024-05-04 ENCOUNTER — Ambulatory Visit: Admitting: Family Medicine

## 2024-05-04 ENCOUNTER — Ambulatory Visit: Payer: Self-pay | Admitting: Family Medicine

## 2024-05-04 VITALS — BP 119/84 | HR 94 | Temp 98.6°F | Ht 62.0 in | Wt 141.0 lb

## 2024-05-04 DIAGNOSIS — R051 Acute cough: Secondary | ICD-10-CM | POA: Diagnosis not present

## 2024-05-04 DIAGNOSIS — J029 Acute pharyngitis, unspecified: Secondary | ICD-10-CM | POA: Diagnosis not present

## 2024-05-04 DIAGNOSIS — J069 Acute upper respiratory infection, unspecified: Secondary | ICD-10-CM | POA: Diagnosis not present

## 2024-05-04 DIAGNOSIS — R519 Headache, unspecified: Secondary | ICD-10-CM

## 2024-05-04 LAB — POCT RAPID STREP A (OFFICE): Rapid Strep A Screen: NEGATIVE

## 2024-05-04 LAB — POCT INFLUENZA A/B
Influenza A, POC: NEGATIVE
Influenza B, POC: NEGATIVE

## 2024-05-04 LAB — POC COVID19 BINAXNOW: SARS Coronavirus 2 Ag: NEGATIVE

## 2024-05-04 MED ORDER — PROMETHAZINE-DM 6.25-15 MG/5ML PO SYRP: 2.5000 mL | ORAL_SOLUTION | Freq: Four times a day (QID) | ORAL | 0 refills | Status: AC | PRN

## 2024-05-04 NOTE — Telephone Encounter (Signed)
 FYI Only or Action Required?: FYI only for provider.  Patient was last seen in primary care on 04/22/2024 by Vicki App, NP.  Called Nurse Triage reporting Sore Throat.  Symptoms began yesterday.  Interventions attempted: OTC medications: Benadryl and Rest, hydration, or home remedies.  Symptoms are: gradually worsening.  Triage Disposition: See Physician Within 24 Hours  Patient/caregiver understands and will follow disposition?: Yes  Copied from CRM #[id number]. Topic: Clinical - Red Word Triage >> May 04, 2024  8:16 AM Vicki Reese wrote: Red Word that prompted transfer to Nurse Triage: sore throat and headache(rated 8) Reason for Disposition  [1] MODERATE headache (e.g., interferes with normal activities) AND [2] present > 24 hours AND [3] unexplained  (Exceptions: Pain medicines not tried, typical migraine, or headache part of viral illness.)  Answer Assessment - Initial Assessment Questions 1. LOCATION: Where does it hurt?      Around the Eyes, Exacerbated by Eye movement  2. ONSET: When did the headache start? (e.g., minutes, hours, days)      Yesterday  3. PATTERN: Does the pain come and go, or has it been constant since it started?     Constant  4. SEVERITY: How bad is the pain? and What does it keep you from doing?  (e.g., Scale 1-10; mild, moderate, or severe)     Moderate  5. RECURRENT SYMPTOM: Have you ever had headaches before? If Yes, ask: When was the last time? and What happened that time?      No  6. CAUSE: What do you think is causing the headache?     Unsure  7. MIGRAINE: Have you been diagnosed with migraine headaches? If Yes, ask: Is this headache similar?      Migraines used to come with menstrual cycles, patient denies having those now  8. HEAD INJURY: Has there been any recent injury to your head?      No  9. OTHER SYMPTOMS: Do you have any other symptoms? (e.g., fever, stiff neck, eye pain, sore throat, cold symptoms)      Sore throat, cough, chills  10. PREGNANCY: Is there any chance you are pregnant? When was your last menstrual period?       No and No  Protocols used: Headache-A-AH

## 2024-05-04 NOTE — Progress Notes (Signed)
 Acute Office Visit  Introduced to nurse practitioner role and practice setting.  All questions answered.  Discussed provider/patient relationship and expectations.   Subjective:     Patient ID: Vicki Reese, female    DOB: 12-05-1970, 53 y.o.   MRN: [id number]  Chief Complaint  Patient presents with   Headache   Sore Throat    Patient started feeling sleepy yesterday and went to bed early.  She woke up this morning with sore throat and headache.  Patient did take tylenol  this morning to help her headache.  She did not get any relief from the tylenol .    Discussed the use of AI scribe software for clinical note transcription with the patient, who gave verbal consent to proceed.  History of Present Illness  Sore Throat and Headache Symptoms started yesterday, fatigue, headache, sore throat. Took benedryl last night, slept until 730am. Very tired this morning, denies fevers, chills, shivering, She is able to eat and drink, no body aches. No chest pain, SOB, DOB, palpitations, rash. No one else is sick around her. No exposures she is aware. Headache not like migraines, she states dull ache behind eyes. Pressure relieved with sleep mask/ gel mask. Denies vision changes, dizziness. Concerns for flu vs covid vs strep.     Headache  This is a new problem. The current episode started yesterday. The problem has been unchanged. The pain is located in the Retro-orbital region. The pain does not radiate. The quality of the pain is described as aching and dull. The pain is at a severity of 4/10. The pain is mild. Associated symptoms include coughing, eye pain and a sore throat. Pertinent negatives include no abdominal pain, abnormal behavior, anorexia, back pain, blurred vision, drainage, ear pain, eye redness, eye watering, facial sweating, fever, hearing loss, insomnia, loss of balance, muscle aches, nausea, neck pain, numbness, phonophobia, photophobia, rhinorrhea, scalp tenderness, seizures,  sinus pressure, swollen glands, tingling, tinnitus, visual change, vomiting, weakness or weight loss. She has tried acetaminophen  for the symptoms. The treatment provided mild relief. Her past medical history is significant for migraine headaches.  Sore Throat  This is a new problem. The current episode started yesterday. The problem has been unchanged. There has been no fever. The pain is at a severity of 4/10. The pain is mild. Associated symptoms include coughing and headaches. Pertinent negatives include no abdominal pain, ear pain, neck pain, swollen glands or vomiting. She has tried acetaminophen  for the symptoms. The treatment provided mild relief.    Review of Systems  Constitutional:  Negative for fever and weight loss.  HENT:  Positive for sore throat. Negative for ear pain, hearing loss, rhinorrhea, sinus pressure and tinnitus.   Eyes:  Positive for pain. Negative for blurred vision, photophobia and redness.  Respiratory:  Positive for cough.   Gastrointestinal:  Negative for abdominal pain, anorexia, nausea and vomiting.  Musculoskeletal:  Negative for back pain and neck pain.  Neurological:  Positive for headaches. Negative for tingling, seizures, weakness, numbness and loss of balance.  Psychiatric/Behavioral:  The patient does not have insomnia.         Objective:    BP 119/84 (BP Location: Left Arm, Patient Position: Sitting, Cuff Size: Normal)   Pulse 94   Temp 98.6 F (37 C) (Oral)   Ht 5' 2 (1.575 m)   Wt 141 lb (64 kg)   LMP 06/06/2016 (Approximate)   SpO2 98%   BMI 25.79 kg/m    Physical Exam Constitutional:  General: She is not in acute distress.    Appearance: She is well-developed and normal weight. She is not ill-appearing, toxic-appearing or diaphoretic.  HENT:     Head: Normocephalic.     Right Ear: Tympanic membrane and ear canal normal. No drainage, swelling or tenderness. No middle ear effusion. Tympanic membrane is not erythematous.     Left  Ear: Tympanic membrane and ear canal normal. No drainage, swelling or tenderness.  No middle ear effusion. Tympanic membrane is not erythematous.     Nose: Congestion present. No rhinorrhea.     Mouth/Throat:     Mouth: Mucous membranes are moist. No oral lesions.     Pharynx: Uvula midline. Posterior oropharyngeal erythema present. No pharyngeal swelling, oropharyngeal exudate or uvula swelling.     Tonsils: No tonsillar exudate or tonsillar abscesses. 0 on the right. 0 on the left.  Eyes:     Extraocular Movements:     Right eye: Normal extraocular motion.     Left eye: Normal extraocular motion.     Conjunctiva/sclera: Conjunctivae normal.     Pupils: Pupils are equal, round, and reactive to light.  Cardiovascular:     Rate and Rhythm: Normal rate and regular rhythm.     Heart sounds: Normal heart sounds. No murmur heard.    No gallop.  Pulmonary:     Effort: Pulmonary effort is normal. No respiratory distress.     Breath sounds: Normal breath sounds. No stridor. No wheezing, rhonchi or rales.  Chest:     Chest wall: No tenderness.  Musculoskeletal:        General: Normal range of motion.     Cervical back: Normal range of motion.  Lymphadenopathy:     Cervical: No cervical adenopathy.  Skin:    General: Skin is warm and dry.     Capillary Refill: Capillary refill takes less than 2 seconds.  Neurological:     General: No focal deficit present.     Mental Status: She is alert and oriented to person, place, and time.     GCS: GCS eye subscore is 4. GCS verbal subscore is 5. GCS motor subscore is 6.  Psychiatric:        Mood and Affect: Mood normal.        Speech: Speech normal.        Behavior: Behavior normal.     Results for orders placed or performed in visit on 05/04/24  POCT Influenza A/B  Result Value Ref Range   Influenza A, POC Negative Negative   Influenza B, POC Negative Negative  POCT rapid strep A  Result Value Ref Range   Rapid Strep A Screen Negative  Negative  POC COVID-19  Result Value Ref Range   SARS Coronavirus 2 Ag Negative Negative        Assessment & Plan:  Assessment and Plan Assessment & Plan   Pt presents for concerns for sudden onset of fatigue, cough, core throat, and dull headache behind eyes. Test for flu, covid, and strep - all negative. Appears at this time to have developed acute viral upper respiratory infection. Recommend continue use of tylenol  and ibuprofen for sore throat and headache pain. Recommend hot teat with honey for sore throat and cough. May gargle with warm salt water for sore throat relief.   Will write for promethazine  DM for cough relief.  Increase daily water intake May use OTC flonase to help with mild congestion.  Will hold on any antibiotics at this  given timeline and symptom start. If symptoms persist beyond one week, recommend further evaluation. If symptoms worsen, fever develops, appetite/hydration changes - please follow up sooner.    Problem List Items Addressed This Visit   None Visit Diagnoses       Acute cough    -  Primary   Relevant Medications   promethazine -dextromethorphan (PROMETHAZINE -DM) 6.25-15 MG/5ML syrup   Other Relevant Orders   POCT Influenza A/B (Completed)   POCT rapid strep A (Completed)   POC COVID-19 (Completed)       Meds ordered this encounter  Medications   promethazine -dextromethorphan (PROMETHAZINE -DM) 6.25-15 MG/5ML syrup    Sig: Take 2.5 mLs by mouth 4 (four) times daily as needed for cough.    Dispense:  118 mL    Refill:  0    Return if symptoms worsen or fail to improve.  Curtis DELENA Boom, FNP  I, Curtis DELENA Boom, FNP, have reviewed all documentation for this visit. The documentation on 05/04/24 for the exam, diagnosis, procedures, and orders are all accurate and complete.

## 2024-05-05 ENCOUNTER — Encounter

## 2024-05-06 ENCOUNTER — Ambulatory Visit: Payer: Self-pay

## 2024-05-06 ENCOUNTER — Ambulatory Visit: Admitting: Family Medicine

## 2024-05-06 ENCOUNTER — Encounter: Payer: Self-pay | Admitting: Family Medicine

## 2024-05-06 VITALS — BP 124/88 | HR 101 | Temp 98.0°F

## 2024-05-06 DIAGNOSIS — R051 Acute cough: Secondary | ICD-10-CM | POA: Diagnosis not present

## 2024-05-06 DIAGNOSIS — R6889 Other general symptoms and signs: Secondary | ICD-10-CM

## 2024-05-06 LAB — POC COVID19 BINAXNOW: SARS Coronavirus 2 Ag: NEGATIVE

## 2024-05-06 MED ORDER — ALBUTEROL SULFATE HFA 108 (90 BASE) MCG/ACT IN AERS: 2.0000 | INHALATION_SPRAY | Freq: Four times a day (QID) | RESPIRATORY_TRACT | 0 refills | Status: AC | PRN

## 2024-05-06 MED ORDER — PREDNISONE 20 MG PO TABS: ORAL_TABLET | ORAL | 0 refills | Status: AC

## 2024-05-06 NOTE — Progress Notes (Signed)
 Vicki Abdulla T. Shaili Donalson, MD, CAQ [facility] [address] [place], [address]  Phone: [phone]  FAX: [phone]  Vicki Reese - 53 y.o. female  MRN [id number]  Date of Birth: 03/26/1971  Date: 05/06/2024  PCP: Gretel App, NP  Referral: Gretel App, NP  Chief Complaint  Patient presents with   Cough    Seen by Andrez Boom, FNP on 05/04/24-Not feeling better   Headache   Sore Throat   Dizziness   Shaking   Fatigue        Subjective:   Vicki Reese is a 53 y.o. very pleasant female patient with There is no height or weight on file to calculate BMI. who presents with the following:  Discussed the use of AI scribe software for clinical note transcription with the patient, who gave verbal consent to proceed.  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  She actually was seen 2 days ago at [facility] family practice.  At that time, influenza, rapid strep and coronavirus were all negative.  She reported some shortness of breath to nursing triage. History of Present Illness Vicki Reese is a 53 year old female with lupus who presents with fatigue, sore throat, and dizziness.  She has been experiencing unusual fatigue since Sunday after returning from church, which is atypical for her. She went to bed early and slept until Monday morning, waking up with a severe sore throat and headache. Initially, there was no cough, but she felt very tired.  On Monday, she visited a doctor suspecting strep throat, but tests for strep, flu, and COVID-19 were negative. Despite this, she continued to feel lethargic and experienced body aches and chills, though not feverish chills, more like shakiness.  By Tuesday, her symptoms worsened, including increased dizziness and difficulty breathing. She has been taking prescription cough medicine since Monday, but it has not provided relief. She is  not coughing at night and is able to sleep well.  She works for [employer], which involves working with immunocompromised individuals, and her employer prefers she stays home until she recovers. She is frustrated about missing work and being bedridden for several days.  She was recently diagnosed with lupus and is taking naltrexone. She is concerned about how lupus might affect her immune response, as she rarely experiences prolonged illness.    Review of Systems is noted in the HPI, as appropriate  Objective:   BP 124/88   Pulse (!) 101   Temp 98 F (36.7 C) (Temporal)   LMP 06/06/2016 (Approximate)   SpO2 98%    Gen: WDWN, cooperative. Globally Non-toxic HEENT: Normocephalic and atraumatic. Throat clear, w/o exudate, R TM clear, L TM - good landmarks, No fluid present. rhinnorhea. No frontal or maxillary sinus T. MMM NECK: Anterior cervical  LAD is present CV: RRR, No M/G/R, cap refill <2 sec PULM: Breathing comfortably in no respiratory distress. no wheezing, crackles, rhonchi ABD: S,NT,ND,+BS. No HSM. No rebound. MSK: Nml gait   Laboratory and Imaging Data: Results for orders placed or performed in visit on 05/06/24  POC COVID-19   Collection Time: 05/06/24  2:48 PM  Result Value Ref Range   SARS Coronavirus 2 Ag Negative Negative     Assessment and Plan:     ICD-10-CM   1. Flu-like symptoms  R68.89     2. Acute cough  R05.1 POC COVID-19  Assessment & Plan Acute viral upper respiratory infection Symptoms consistent with acute viral upper respiratory infection. COVID-19, flu, and strep tests negative. Differential includes RSV or parainfluenza. Significant fatigue and dizziness likely due to respiratory issues. - Administer albuterol inhaler for bronchospasm relief. - Prescribe oral prednisone  to reduce inflammation and improve breathing. - Advise rest and absence from work until Monday. - Provide work note for absence from Monday through  Friday.  Systemic lupus erythematosus Recently diagnosed with systemic lupus erythematosus.  She is not on immune modulators.  Medication Management during today's office visit: Meds ordered this encounter  Medications   predniSONE  (DELTASONE ) 20 MG tablet    Sig: 2 tabs po for 4 days, then 1 tab po for 4 days    Dispense:  12 tablet    Refill:  0   albuterol (VENTOLIN HFA) 108 (90 Base) MCG/ACT inhaler    Sig: Inhale 2 puffs into the lungs every 6 (six) hours as needed for wheezing or shortness of breath.    Dispense:  1 each    Refill:  0   There are no discontinued medications.  Orders placed today for conditions managed today: Orders Placed This Encounter  Procedures   POC COVID-19    Disposition: No follow-ups on file.  [employer] speech-to-text software was used for transcription in this dictation.  Possible transcriptional errors can occur using Animal nutritionist.   Signed,  Jacques DASEN. Turki Tapanes, MD   Outpatient Encounter Medications as of 05/06/2024  Medication Sig   albuterol (VENTOLIN HFA) 108 (90 Base) MCG/ACT inhaler Inhale 2 puffs into the lungs every 6 (six) hours as needed for wheezing or shortness of breath.   buPROPion  (WELLBUTRIN  XL) 300 MG 24 hr tablet Take 1 tablet (300 mg total) by mouth daily.   busPIRone  (BUSPAR ) 7.5 MG tablet TAKE ONE TABLET BY MOUTH TWICE DAILY   Estradiol  6 MG PLLT by Implant route every 3 (three) months.   magnesium gluconate (MAGONATE) 500 (27 Mg) MG TABS tablet Take 500 mg by mouth at bedtime.   Naltrexone HCl, Pain, 4.5 MG CAPS Take by mouth daily.   pantoprazole  (PROTONIX ) 40 MG tablet Take 1 tablet (40 mg total) by mouth daily.   predniSONE  (DELTASONE ) 20 MG tablet 2 tabs po for 4 days, then 1 tab po for 4 days   progesterone (PROMETRIUM) 100 MG capsule Take 100 mg by mouth at bedtime.   promethazine -dextromethorphan (PROMETHAZINE -DM) 6.25-15 MG/5ML syrup Take 2.5 mLs by mouth 4 (four) times daily as needed for cough.    Testosterone 25 MG PLLT by Implant route every 3 (three) months.   vitamin B-12 (CYANOCOBALAMIN) 100 MCG tablet Take 100 mcg by mouth daily.   Vitamin D-Vitamin K (VITAMIN D2 + K1 PO) Take by mouth daily.   No facility-administered encounter medications on file as of 05/06/2024.

## 2024-05-06 NOTE — Telephone Encounter (Signed)
 FYI Only or Action Required?: Action required by provider: request for appointment.: pt would like to be worked in to see PCP today: please call pt.  Patient was last seen in primary care on 05/04/2024 by Wellington Curtis LABOR, FNP.  Called Nurse Triage reporting Shortness of Breath.  Symptoms began Monday.  Interventions attempted: Nothing.  Symptoms are: gradually worsening.  Triage Disposition: See HCP Within 4 Hours (Or PCP Triage)  Patient/caregiver understands and will follow disposition?: Yes    Copied from CRM [id number]. Topic: Clinical - Red Word Triage >> May 06, 2024  8:05 AM Henretta I wrote: Red Word that prompted transfer to Nurse Triage: lower pain in back, dizzy, weak, headache, and sore throat, hard time breathing symptoms are worsening Reason for Disposition  [1] MILD difficulty breathing (e.g., minimal/no SOB at rest, SOB with walking, pulse < 100) AND [2] NEW-onset or WORSE than normal  Answer Assessment - Initial Assessment Questions 1. RESPIRATORY STATUS: Describe your breathing? (e.g., wheezing, shortness of breath, unable to speak, severe coughing)      SOB when standing, severe coughing 2. ONSET: When did this breathing problem begin?      Monday 3. PATTERN Does the difficult breathing come and go, or has it been constant since it started?      constant 4. SEVERITY: How bad is your breathing? (e.g., mild, moderate, severe)      moderate 5. RECURRENT SYMPTOM: Have you had difficulty breathing before? If Yes, ask: When was the last time? and What happened that time?      no 6. CARDIAC HISTORY: Do you have any history of heart disease? (e.g., heart attack, angina, bypass surgery, angioplasty)      no 7. LUNG HISTORY: Do you have any history of lung disease?  (e.g., pulmonary embolus, asthma, emphysema)     no 8. CAUSE: What do you think is causing the breathing problem?      Cold s/s 9. OTHER SYMPTOMS: Do you have any other symptoms? (e.g.,  chest pain, cough, dizziness, fever, runny nose)     Headache, dizziness, cough-nonproductive, congestion, metallic taste in mouth, sore throat-redness & swelling, weakness 10. O2 SATURATION MONITOR:  Do you use an oxygen saturation monitor (pulse oximeter) at home? If Yes, ask: What is your reading (oxygen level) today? What is your usual oxygen saturation reading? (e.g., 95%)       na 11. PREGNANCY: Is there any chance you are pregnant? When was your last menstrual period?       na 12. TRAVEL: Have you traveled out of the country in the last month? (e.g., travel history, exposures)       na  Protocols used: Breathing Difficulty-A-AH

## 2024-05-07 ENCOUNTER — Encounter: Payer: Self-pay | Admitting: Nurse Practitioner

## 2024-05-07 DIAGNOSIS — Z79890 Hormone replacement therapy (postmenopausal): Secondary | ICD-10-CM | POA: Insufficient documentation

## 2024-05-07 DIAGNOSIS — M329 Systemic lupus erythematosus, unspecified: Secondary | ICD-10-CM | POA: Insufficient documentation

## 2024-05-07 NOTE — Assessment & Plan Note (Signed)
 Routine wellness visit reveals no new significant health issues. Physical exam complete. Lab work deferred today as she has recently had labs with her other provider. Pap smears are no longer indicated due to hysterectomy. Mammogram is scheduled for September 30th. She had a negative Cologuard in 2023. Weight loss and overall well-being have improved. She declines the second shingles vaccine due to an adverse reaction to the first dose and also declines flu and COVID vaccines at this time. Tetanus vaccine is up to date. She maintains an active lifestyle, balanced diet, and abstains from smoking and drug use, with social alcohol consumption. Regular eye exams are conducted, but dental visits are avoided due to fear. Continue current lifestyle and dietary habits. Return to care in one year, sooner as needed.

## 2024-05-07 NOTE — Assessment & Plan Note (Signed)
 GERD symptoms are well-controlled with Protonix . Continue Protonix  and dietary modifications.

## 2024-05-07 NOTE — Assessment & Plan Note (Signed)
 Lupus symptoms have improved with naltrexone. She is not currently interested in a rheumatology referral unless her condition worsens. Regular monitoring is conducted by her integrative medicine provider. Continue naltrexone for inflammation. Follow up with the integrative medicine provider every three months. Monitor for any changes or worsening of symptoms.

## 2024-05-07 NOTE — Assessment & Plan Note (Signed)
 Depression and anxiety are well-managed with Wellbutrin  and Buspar . Emotional well-being has improved, with occasional stress related to her son's college transition. Continue Wellbutrin  and Buspar . Monitor emotional well-being and stress levels.

## 2024-05-07 NOTE — Assessment & Plan Note (Signed)
 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 Continue estradiol  and testosterone pellet therapy. Follow up with the integrative medicine provider for pellet therapy every three months.

## 2024-05-26 ENCOUNTER — Other Ambulatory Visit: Payer: Self-pay | Admitting: Medical Genetics

## 2024-05-26 DIAGNOSIS — Z006 Encounter for examination for normal comparison and control in clinical research program: Secondary | ICD-10-CM

## 2024-07-20 ENCOUNTER — Ambulatory Visit
Admission: RE | Admit: 2024-07-20 | Discharge: 2024-07-20 | Disposition: A | Source: Ambulatory Visit | Attending: Nurse Practitioner

## 2024-07-20 DIAGNOSIS — Z1231 Encounter for screening mammogram for malignant neoplasm of breast: Secondary | ICD-10-CM

## 2024-07-23 ENCOUNTER — Ambulatory Visit: Payer: Self-pay | Admitting: Nurse Practitioner

## 2024-07-23 NOTE — Telephone Encounter (Signed)
 Noted
